# Patient Record
Sex: Male | Born: 1985 | Race: White | Hispanic: No | Smoking: Current every day smoker
Health system: Southern US, Community
[De-identification: ages and names within clinical notes are randomized; demographics above are authoritative.]

## PROBLEM LIST (undated history)

## (undated) DIAGNOSIS — I499 Cardiac arrhythmia, unspecified: Secondary | ICD-10-CM

---

## 2012-01-08 ENCOUNTER — Emergency Department: Payer: Self-pay | Admitting: Unknown Physician Specialty

## 2012-01-08 LAB — BASIC METABOLIC PANEL
Anion Gap: 9 (ref 7–16)
Co2: 26 mmol/L (ref 21–32)
Creatinine: 0.93 mg/dL (ref 0.60–1.30)
EGFR (Non-African Amer.): >60
Glucose: 85 mg/dL (ref 65–99)

## 2012-01-08 LAB — CBC
HCT: 44.1 % (ref 40.0–52.0)
HGB: 14.1 g/dL (ref 13.0–18.0)
MCHC: 32 g/dL (ref 32.0–36.0)
MCV: 79 fL — ABNORMAL LOW (ref 80–100)
RDW: 15.2 % — ABNORMAL HIGH (ref 11.5–14.5)

## 2012-03-14 ENCOUNTER — Emergency Department: Payer: Self-pay | Admitting: Unknown Physician Specialty

## 2012-03-14 LAB — BASIC METABOLIC PANEL
BUN: 5 mg/dL — ABNORMAL LOW (ref 7–18)
Calcium, Total: 9.1 mg/dL (ref 8.5–10.1)
Glucose: 116 mg/dL — ABNORMAL HIGH (ref 65–99)
Osmolality: 278 (ref 275–301)
Potassium: 3.8 mmol/L (ref 3.5–5.1)
Sodium: 140 mmol/L (ref 136–145)

## 2012-03-14 LAB — CBC
MCH: 25.4 pg — ABNORMAL LOW (ref 26.0–34.0)
MCHC: 32 g/dL (ref 32.0–36.0)
MCV: 79 fL — ABNORMAL LOW (ref 80–100)
RBC: 5.16 10*6/uL (ref 4.40–5.90)
RDW: 14.8 % — ABNORMAL HIGH (ref 11.5–14.5)
WBC: 7.1 10*3/uL (ref 3.8–10.6)

## 2012-03-14 LAB — URINALYSIS, COMPLETE
Bilirubin,UR: NEGATIVE
Glucose,UR: NEGATIVE mg/dL (ref 0–75)
Leukocyte Esterase: NEGATIVE
Nitrite: NEGATIVE
Ph: 7 (ref 4.5–8.0)
Protein: NEGATIVE
RBC,UR: <1 /HPF (ref 0–5)
WBC UR: NONE SEEN /HPF (ref 0–5)

## 2012-07-06 ENCOUNTER — Emergency Department: Payer: Self-pay | Admitting: Emergency Medicine

## 2012-07-08 ENCOUNTER — Emergency Department: Payer: Self-pay | Admitting: Emergency Medicine

## 2013-01-23 ENCOUNTER — Emergency Department (HOSPITAL_COMMUNITY): Payer: Self-pay

## 2013-01-23 ENCOUNTER — Encounter (HOSPITAL_COMMUNITY): Payer: Self-pay | Admitting: *Deleted

## 2013-01-23 ENCOUNTER — Emergency Department (HOSPITAL_COMMUNITY)
Admission: EM | Admit: 2013-01-23 | Discharge: 2013-01-23 | Disposition: A | Discharge status: Home / Self Care | Payer: Self-pay | Attending: Emergency Medicine | Admitting: Emergency Medicine

## 2013-01-23 DIAGNOSIS — Z8679 Personal history of other diseases of the circulatory system: Secondary | ICD-10-CM | POA: Insufficient documentation

## 2013-01-23 DIAGNOSIS — F17200 Nicotine dependence, unspecified, uncomplicated: Secondary | ICD-10-CM | POA: Insufficient documentation

## 2013-01-23 DIAGNOSIS — R079 Chest pain, unspecified: Secondary | ICD-10-CM | POA: Insufficient documentation

## 2013-01-23 HISTORY — DX: Cardiac arrhythmia, unspecified: I49.9

## 2013-01-23 LAB — CBC WITH DIFFERENTIAL/PLATELET
Basophils Absolute: 0 10*3/uL (ref 0.0–0.1)
Eosinophils Absolute: 0.3 10*3/uL (ref 0.0–0.7)
Eosinophils Relative: 2 % (ref 0–5)
MCH: 25.9 pg — ABNORMAL LOW (ref 26.0–34.0)
MCHC: 34.1 g/dL (ref 30.0–36.0)
MCV: 75.9 fL — ABNORMAL LOW (ref 78.0–100.0)
Platelets: 219 10*3/uL (ref 150–400)
RDW: 14.8 % (ref 11.5–15.5)

## 2013-01-23 LAB — BASIC METABOLIC PANEL
BUN: 8 mg/dL (ref 6–23)
CO2: 27 mEq/L (ref 19–32)
Chloride: 99 mEq/L (ref 96–112)
GFR calc Af Amer: >90 mL/min (ref >90)
Glucose, Bld: 105 mg/dL — ABNORMAL HIGH (ref 70–99)
Sodium: 134 mEq/L — ABNORMAL LOW (ref 135–145)

## 2013-01-23 MED ORDER — KETOROLAC TROMETHAMINE 60 MG/2ML IM SOLN
60.0000 mg | Freq: Once | INTRAMUSCULAR | Status: AC
  Administered 2013-01-23: 60 mg via INTRAMUSCULAR
  Filled 2013-01-23: qty 2

## 2013-01-23 MED ORDER — OXYCODONE-ACETAMINOPHEN 5-325 MG PO TABS
2.0000 | ORAL_TABLET | Freq: Once | ORAL | Status: AC
  Administered 2013-01-23: 2 via ORAL
  Filled 2013-01-23 (×2): qty 1

## 2013-01-23 MED ORDER — IBUPROFEN 800 MG PO TABS: 800.0000 mg | ORAL_TABLET | Freq: Three times a day (TID) | ORAL | Status: DC

## 2013-01-23 NOTE — ED Notes (Signed)
Patient transported to X-ray 

## 2013-01-23 NOTE — ED Notes (Signed)
Pt reports left side chest pain x 30 mins pta- c/o pain with inspiration- speaking full sentences
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
## 2013-03-28 ENCOUNTER — Emergency Department: Payer: Self-pay | Admitting: Internal Medicine

## 2013-05-15 ENCOUNTER — Emergency Department: Payer: Self-pay | Admitting: Emergency Medicine

## 2013-07-01 ENCOUNTER — Emergency Department: Payer: Self-pay | Admitting: Emergency Medicine

## 2013-08-27 ENCOUNTER — Emergency Department: Payer: Self-pay | Admitting: Emergency Medicine

## 2013-10-12 ENCOUNTER — Emergency Department: Payer: Self-pay | Admitting: Emergency Medicine

## 2014-02-09 ENCOUNTER — Emergency Department: Payer: Self-pay | Admitting: Emergency Medicine

## 2014-07-03 ENCOUNTER — Emergency Department: Payer: Self-pay | Admitting: Emergency Medicine

## 2014-10-03 ENCOUNTER — Emergency Department: Payer: Self-pay | Admitting: Emergency Medicine

## 2014-10-03 LAB — ED INFLUENZA
H1N1FLUPCR: NOT DETECTED
Influenza A By PCR: NEGATIVE
Influenza B By PCR: NEGATIVE

## 2014-10-05 LAB — BETA STREP CULTURE(ARMC)

## 2014-11-14 ENCOUNTER — Emergency Department: Payer: Self-pay | Admitting: Emergency Medicine

## 2015-02-16 IMAGING — CR RIGHT HAND - COMPLETE 3+ VIEW
1 series · 3 of 3 positions shown · non-contrast
Comparison: None.

CLINICAL DATA: Recent fall with soft tissue injury

EXAM:
RIGHT HAND - COMPLETE 3+ VIEW

[Series 1: pa · 0.17mm/px · 3 of 3 slices shown]
[im 1/3]
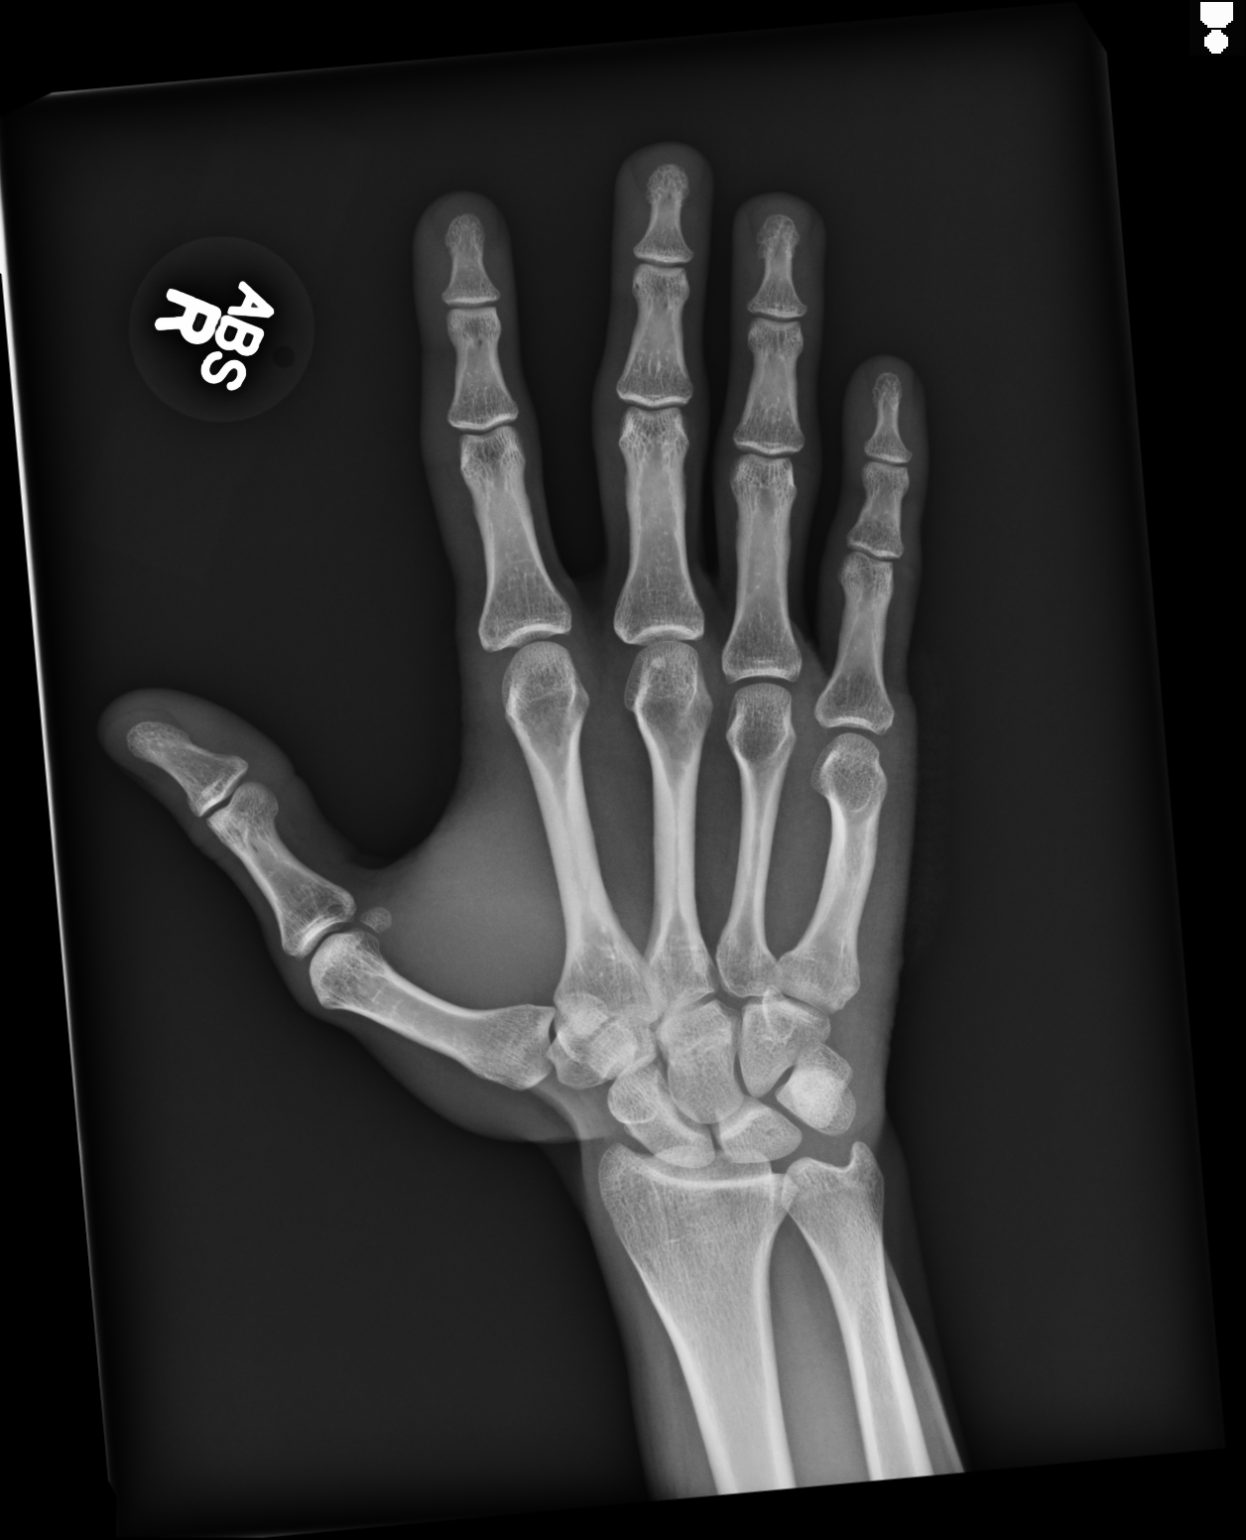
[im 2/3]
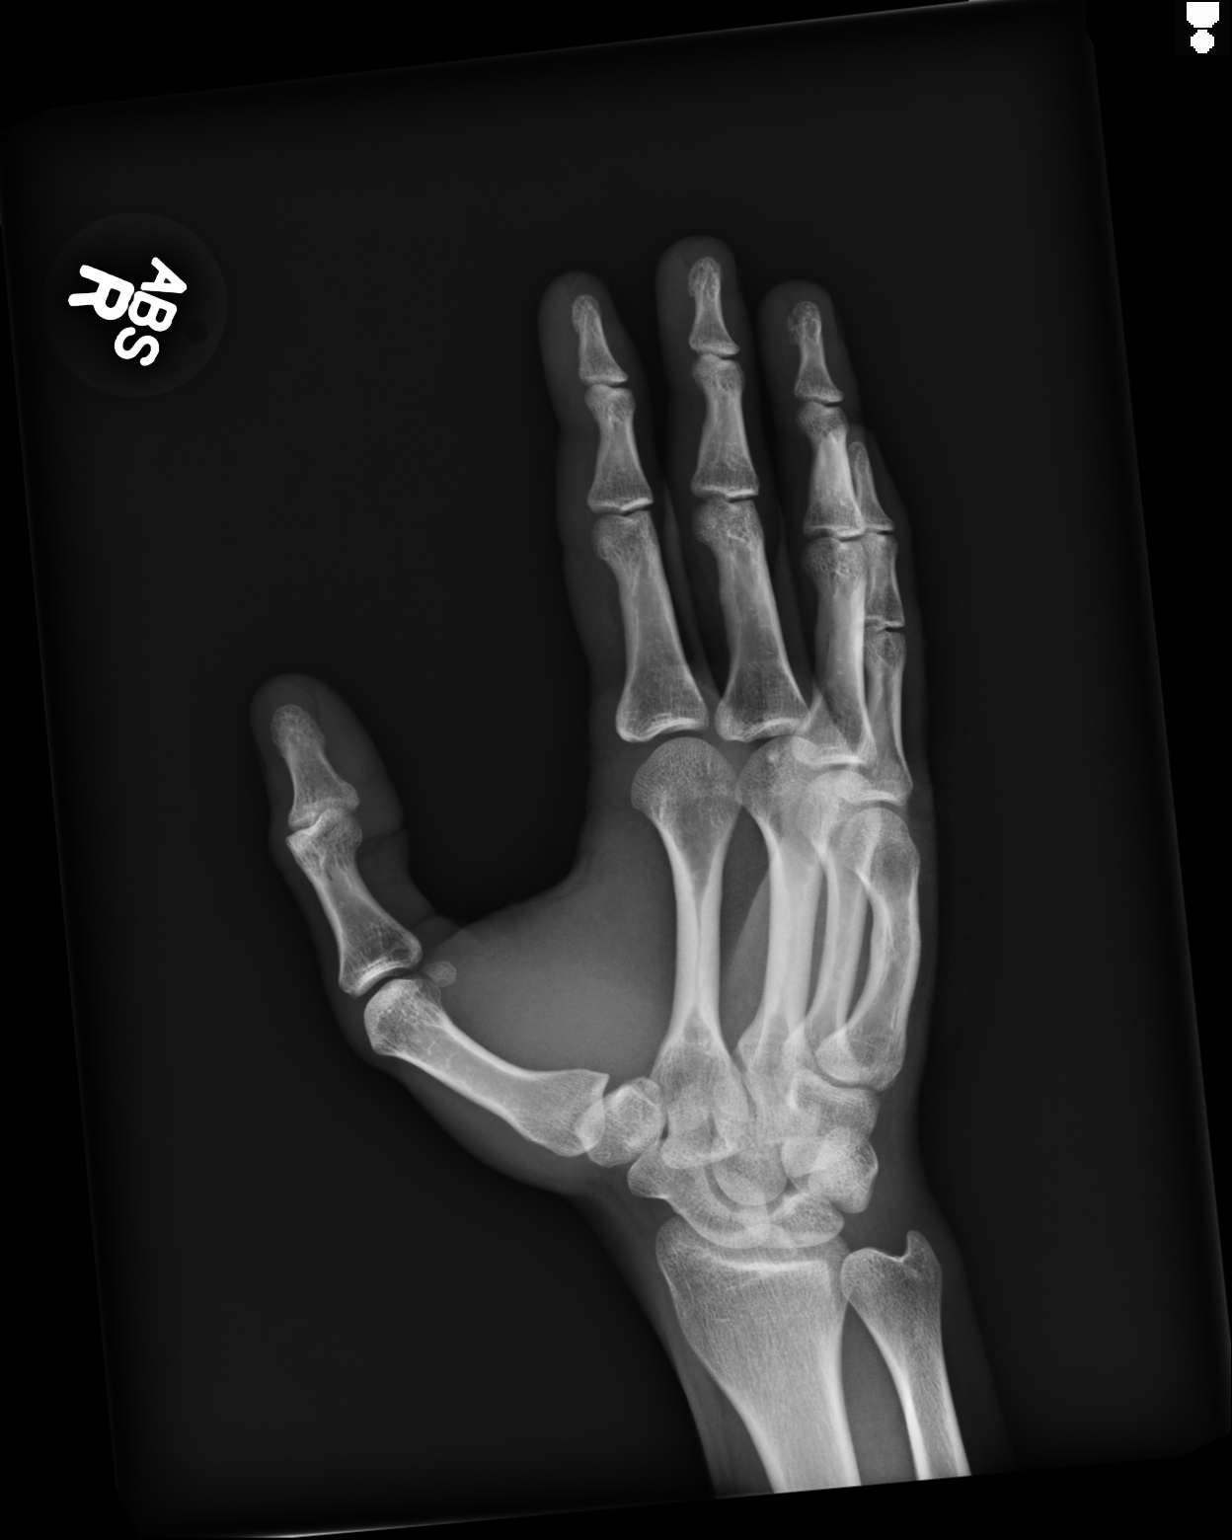
[im 3/3]
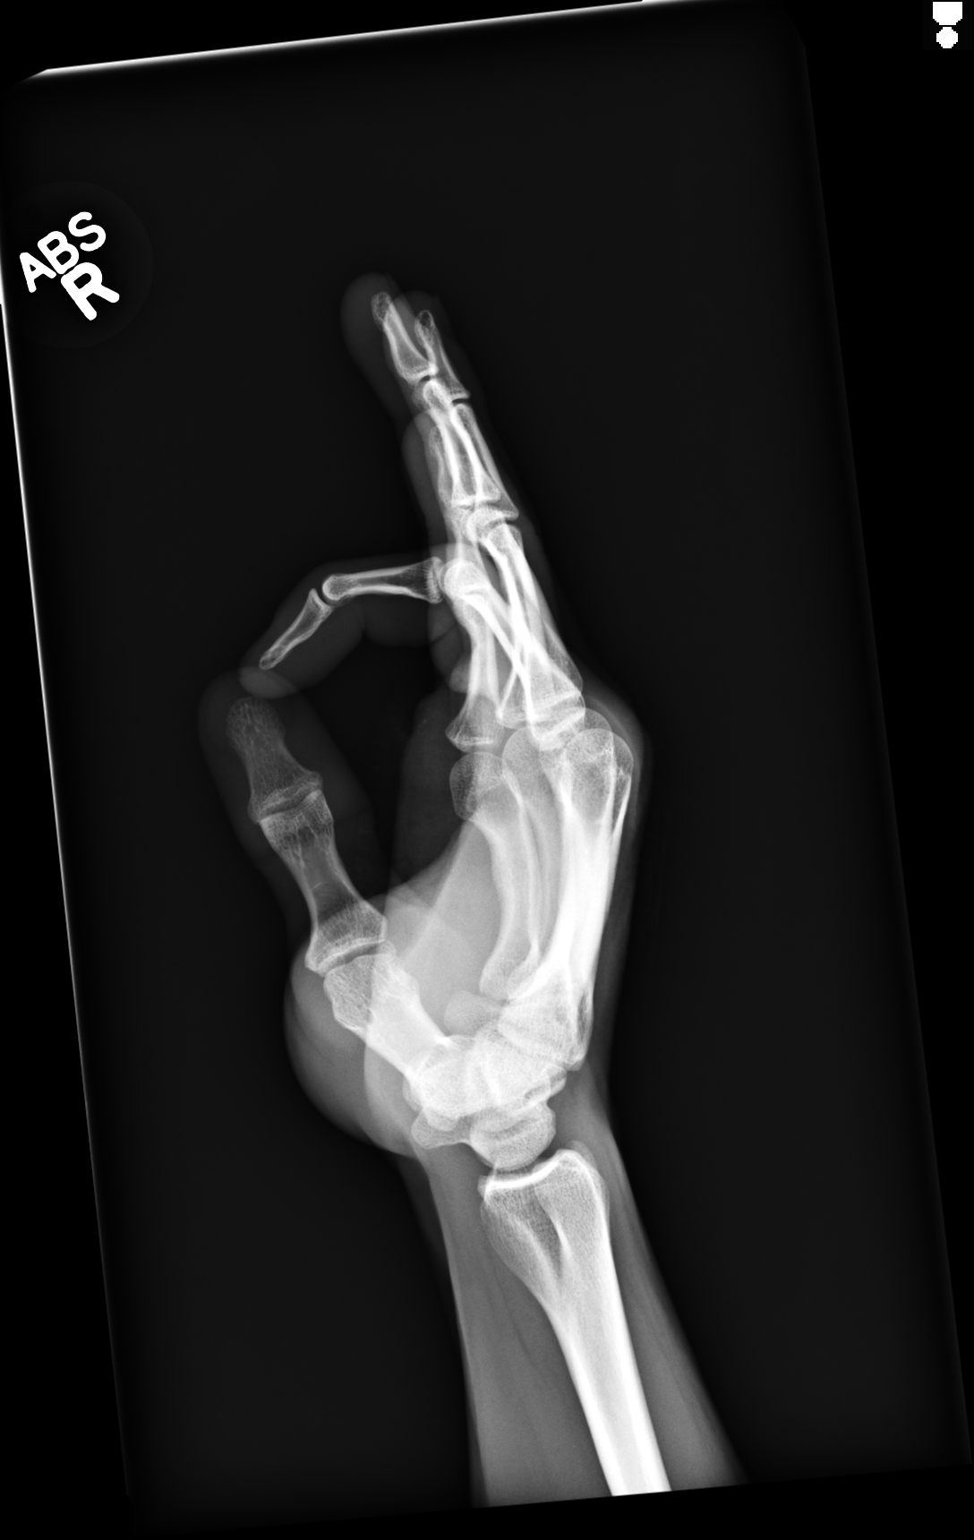

[3 of 3 positions shown; findings below may reference images not displayed]

FINDINGS: No acute fracture or dislocation is noted. No radiopaque foreign
body is seen. Soft tissue injury is noted medially.
IMPRESSION: Soft tissue injury without acute bony abnormality.

## 2015-04-01 ENCOUNTER — Encounter: Payer: Self-pay | Admitting: Emergency Medicine

## 2015-04-01 ENCOUNTER — Emergency Department
Admission: EM | Admit: 2015-04-01 | Discharge: 2015-04-01 | Disposition: A | Discharge status: Home / Self Care | Payer: Self-pay | Attending: Emergency Medicine | Admitting: Emergency Medicine

## 2015-04-01 DIAGNOSIS — Z720 Tobacco use: Secondary | ICD-10-CM | POA: Insufficient documentation

## 2015-04-01 DIAGNOSIS — K0381 Cracked tooth: Secondary | ICD-10-CM | POA: Insufficient documentation

## 2015-04-01 DIAGNOSIS — K0889 Other specified disorders of teeth and supporting structures: Secondary | ICD-10-CM

## 2015-04-01 DIAGNOSIS — K088 Other specified disorders of teeth and supporting structures: Secondary | ICD-10-CM | POA: Insufficient documentation

## 2015-04-01 MED ORDER — TRAMADOL HCL 50 MG PO TABS
50.0000 mg | ORAL_TABLET | Freq: Once | ORAL | Status: AC
  Administered 2015-04-01: 50 mg via ORAL

## 2015-04-01 MED ORDER — TRAMADOL HCL 50 MG PO TABS
ORAL_TABLET | ORAL | Status: AC
  Filled 2015-04-01: qty 1

## 2015-04-01 MED ORDER — IBUPROFEN 800 MG PO TABS: 800.0000 mg | ORAL_TABLET | Freq: Three times a day (TID) | ORAL | Status: DC | PRN

## 2015-04-01 MED ORDER — AMOXICILLIN 500 MG PO CAPS: 500.0000 mg | ORAL_CAPSULE | Freq: Three times a day (TID) | ORAL | Status: DC

## 2015-04-01 MED ORDER — LIDOCAINE VISCOUS 2 % MT SOLN
15.0000 mL | Freq: Once | OROMUCOSAL | Status: AC
  Administered 2015-04-01: 15 mL via OROMUCOSAL

## 2015-04-01 MED ORDER — IBUPROFEN 800 MG PO TABS
ORAL_TABLET | ORAL | Status: AC
  Filled 2015-04-01: qty 1

## 2015-04-01 MED ORDER — TRAMADOL HCL 50 MG PO TABS: 50.0000 mg | ORAL_TABLET | Freq: Four times a day (QID) | ORAL | Status: DC | PRN

## 2015-04-01 MED ORDER — IBUPROFEN 800 MG PO TABS
800.0000 mg | ORAL_TABLET | Freq: Once | ORAL | Status: AC
  Administered 2015-04-01: 800 mg via ORAL

## 2015-04-01 MED ORDER — LIDOCAINE VISCOUS 2 % MT SOLN
OROMUCOSAL | Status: AC
  Filled 2015-04-01: qty 15
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
## 2015-04-01 NOTE — ED Notes (Signed)
See providers note for full assessment

## 2015-04-01 NOTE — Discharge Instructions (Signed)
Follow-up from list of dental clinics. °OPTIONS FOR DENTAL FOLLOW UP CARE ° °[place] Department of Health and Human Services - Local Safety Net Dental Clinics °http://www.ncdhhs.gov/dph/oralhealth/services/safetynetclinics.htm °  °[facility] ([phone]) ° °[facility] ([phone]) ° °[facility] ([phone]) ° °[facility] ([phone]) ° °[facility] ([phone]) °This clinic caters to the indigent population and is on a lottery system. °Location: °[facility] [place], [address], [place] °Clinic Hours: °Wednesdays from 6pm - 9pm, patients seen by a lottery system. °For dates, call or go to [email or web address] °Services: °Cleanings, fillings and simple extractions. °Payment Options: °DENTAL WORK IS FREE OF CHARGE. Bring proof of income or support. °Best way to get seen: °Arrive at 5:15 pm - this is a lottery, NOT first come/first serve, so arriving earlier will not increase your chances of being seen. °  °  °[facility] °[phone] °Select option 1 for emergencies °  °Location: °[facility], [address], [place] °Clinic Hours: °No walk-ins accepted - call the day before to schedule an appointment. °Check in times are 9:30 am and 1:30 pm. °Services: °Simple extractions, temporary fillings, pulpectomy/pulp debridement, uncomplicated abscess drainage. °Payment Options: °PAYMENT IS DUE AT THE TIME OF SERVICE.  Fee is usually $100-200, additional surgical procedures (e.g. abscess drainage) may be extra. °Cash, checks, Visa/MasterCard accepted.  Can file Medicaid if patient is covered for dental - patient should call case worker to check. °No discount for [facility] patients. °Best way to get seen: °MUST call the day before and get onto the schedule. Can usually be seen the next 1-2 days. No walk-ins accepted. °  °  °[facility] °[phone] °  °Location: °[facility], [address], [place] °Clinic Hours: °M, W, Th, F 8am or 1:30pm, Tues 9a or 1:30 - first come/first served. °Services: °Simple extractions, temporary fillings, uncomplicated abscess drainage.  You do not need to be an [place] resident. °Payment Options: °PAYMENT IS DUE AT THE TIME OF SERVICE. °Dental insurance, otherwise sliding scale - bring proof of income or support. °Depending on income and treatment needed, cost is usually $50-200. °Best way to get seen: °Arrive early as it is first come/first served. °  °  °[facility] °[phone] °  °Location: °[address] °Clinic Hours: °Mon-Thu 8a-5p °Services: °Most basic dental services including extractions and fillings. °Payment Options: °PAYMENT IS DUE AT THE TIME OF SERVICE. °Sliding scale, up to 50% off - bring proof if income or support. °Medicaid with dental option accepted. °Best way to get seen: °Call to schedule an appointment, can usually be seen within 2 weeks OR they will try to see walk-ins - show up at 8a or 2p (you may have to wait). °  °  °[facility] °[phone] °[place] RESIDENTS ONLY °  °Location: °[facility], [address], [place], [place] [address] °Clinic Hours: By appointment only. °Monday - Thursday 8am-5pm, Friday 8am-12pm °Services: Cleanings, fillings, extractions. °Payment Options: °PAYMENT IS DUE AT THE TIME OF SERVICE. °Cash, Visa or MasterCard. Sliding scale - $30 minimum per service. °Best way to get seen: °Come in to office, complete packet and make an appointment - need proof of income °or support monies for each household member and proof of [place] residence. °Usually takes about a month to get in. °  °  °[facility] °[phone] °  °  Location: °[address] [place] °Clinic Hours: Walk-in Urgent Care Dental Services  are offered Monday-Friday mornings only. °The numbers of emergencies accepted daily is limited to the number of °providers available. °Maximum 15 - Mondays, Wednesdays & Thursdays °Maximum 10 - Tuesdays & Fridays °Services: °You do not need to be a [place] resident to be seen for a dental emergency. °Emergencies are defined as pain, swelling, abnormal bleeding, or dental trauma. Walkins will receive x-rays if needed. °NOTE: Dental cleaning is not an emergency. °Payment Options: °PAYMENT IS DUE AT THE TIME OF SERVICE. °Minimum co-pay is $40.00 for uninsured patients. °Minimum co-pay is $3.00 for Medicaid with dental coverage. °Dental Insurance is accepted and must be presented at time of visit. °Medicare does not cover dental. °Forms of payment: Cash, credit card, checks. °Best way to get seen: °If not previously registered with the clinic, walk-in dental registration begins at 7:15 am and is on a first come/first serve basis. °If previously registered with the clinic, call to make an appointment. °  °  °[facility] °[phone] °[place] RESIDENTS ONLY °  °Location: °[address], [place], [place] °Clinic Hours: °Mon-Thu 10a-2p °Services: Extractions only! °Payment Options: °FREE (donations accepted) - bring proof of income or support °Best way to get seen: °Call and schedule an appointment OR come at 8am on the 1st Monday of every month (except for holidays) when it is first come/first served. °  °  °[facility] °[phone] °  °Location: °[address], [place] °Clinic Hours: °Friday mornings °Services, Payment Options, Best way to get seen: °Call for info ° °

## 2015-04-01 NOTE — ED Notes (Signed)
Pt to ed with c/o dental pain on left side. Pt states he broke a tooth off 2 days ago.  Pt reports increased pain since, unable to eat.

## 2015-06-24 ENCOUNTER — Emergency Department
Admission: EM | Admit: 2015-06-24 | Discharge: 2015-06-24 | Disposition: A | Discharge status: Home / Self Care | Payer: Self-pay | Attending: Student | Admitting: Student

## 2015-06-24 ENCOUNTER — Encounter: Payer: Self-pay | Admitting: Emergency Medicine

## 2015-06-24 DIAGNOSIS — Z720 Tobacco use: Secondary | ICD-10-CM | POA: Insufficient documentation

## 2015-06-24 DIAGNOSIS — F1910 Other psychoactive substance abuse, uncomplicated: Secondary | ICD-10-CM

## 2015-06-24 DIAGNOSIS — F1110 Opioid abuse, uncomplicated: Secondary | ICD-10-CM | POA: Insufficient documentation

## 2015-06-24 DIAGNOSIS — R197 Diarrhea, unspecified: Secondary | ICD-10-CM | POA: Insufficient documentation

## 2015-06-24 DIAGNOSIS — F1410 Cocaine abuse, uncomplicated: Secondary | ICD-10-CM | POA: Insufficient documentation

## 2015-06-24 MED ORDER — NICOTINE 10 MG IN INHA
RESPIRATORY_TRACT | Status: AC
  Filled 2015-06-24: qty 36

## 2015-06-24 MED ORDER — NICOTINE 10 MG IN INHA
1.0000 | RESPIRATORY_TRACT | Status: DC | PRN
Start: 2015-06-24 — End: 2015-06-24

## 2015-06-24 MED ORDER — CYCLOBENZAPRINE HCL 10 MG PO TABS
5.0000 mg | ORAL_TABLET | Freq: Once | ORAL | Status: AC
  Administered 2015-06-24: 5 mg via ORAL
  Filled 2015-06-24: qty 1

## 2015-06-24 NOTE — ED Notes (Signed)
Pt says he is going to [facility] and called them.  They will pick him up.  He went to bathroom to change.

## 2015-06-24 NOTE — BH Assessment (Signed)
Assessment Note  Richard Tanner is an 29 y.o. male presenting to ED for detox from heroin (.5-1gram daily, last use "Saturday" 06/21/15)  and cocaine (.1 gram daily, last use "Saturday" 06/21/15).   Pt reports he began using both substances four months ago. Pt endorses the following sxs of withdrawal: diarrhea, nausea and cramps. Pt states that his girlfriend is a longtime user (currently in rehab) and that he began using in hopes that it would keep them together. Pt states that he currently has a [facility] but came to the ED for detox "just in case". Pt reports a hx of depression however, endorses no current sxs. Pt reports no SI, HI, self-injurious behaviors or hallucinations.   Writer consulted with EDP Dr.Gayle and Pt is not in need of "medical" detox services. Pt does not meet inpatient criteria. Pt informed Clinical research associate that he is able to return to tx facility and was told that facility with transport him back. Pt to be discharged and provided with additional outpatient community resources per [facility].  Axis I: Substance Abuse  Past Medical History:  Past Medical History  Diagnosis Date  . Cardiac arrhythmia     History reviewed. No pertinent past surgical history.  Family History: History reviewed. No pertinent family history.  Social History:  reports that he has been smoking Cigarettes.  He has been smoking about 0.50 packs per day. He has never used smokeless tobacco. He reports that he drinks about 0.6 oz of alcohol per week. He reports that he uses illicit drugs (Marijuana and Cocaine).  Additional Social History:  Alcohol / Drug Use Pain Medications: None Reported Prescriptions: None reported Over the Counter: None Reported History of alcohol / drug use?: Yes Longest period of sobriety (when/how long): None Reported Negative Consequences of Use:  (None Reported) Withdrawal Symptoms: Nausea / Vomiting, Diarrhea, Cramps Substance #1 Name of Substance 1:  Heroin 1 - Age of First Use: 29 1 - Amount (size/oz): 5-1 gram 1 - Frequency: daily 1 - Duration: 4 months 1 - Last Use / Amount: "Saturday" 06/21/15 Substance #2 Name of Substance 2: Cocaine 2 - Age of First Use: 29 2 - Amount (size/oz): .1gram 2 - Frequency: daily 2 - Duration: 4 months 2 - Last Use / Amount: "saturday" 06/21/15  CIWA: CIWA-Ar BP: 128/74 mmHg Pulse Rate: 82 COWS:    Allergies: No Known Allergies  Home Medications:  (Not in a hospital admission)  OB/GYN Status:  No LMP for male patient.  General Assessment Data Location of Assessment: [facility] ED TTS Assessment: In system Is this a Tele or Face-to-Face Assessment?: Face-to-Face Is this an Initial Assessment or a Re-assessment for this encounter?: Initial Assessment Marital status: Separated Maiden name: N/A Is patient pregnant?: No Pregnancy Status: No Living Arrangements: Non-relatives/Friends Can pt return to current living arrangement?: Yes Admission Status: Voluntary Is patient capable of signing voluntary admission?: Yes Referral Source: Self/Family/Friend Insurance type: None  Medical Screening Exam [facility] Walk-in ONLY) Medical Exam completed: Yes  Crisis Care Plan Living Arrangements: Non-relatives/Friends Name of Psychiatrist: None  Name of Therapist: None  Education Status Is patient currently in school?: No Current Grade: N/A Highest grade of school patient has completed: [employer] Name of school: N/A Contact person: Not Provided  Risk to self with the past 6 months Suicidal Ideation: No Has patient been a risk to self within the past 6 months prior to admission? : No Suicidal Intent: No Has patient had any suicidal intent within  the past 6 months prior to admission? : No Is patient at risk for suicide?: No Suicidal Plan?: No Has patient had any suicidal plan within the past 6 months prior to admission? : No Access to Means: No What has been your use of drugs/alcohol within the  last 12 months?: Daily cocaine and heroin use for past 4 months Previous Attempts/Gestures: No How many times?: 0 Other Self Harm Risks: None Reported Intentional Self Injurious Behavior: None Family Suicide History: No Recent stressful life event(s):  (None Reported) Persecutory voices/beliefs?: No Depression: Yes Depression Symptoms:  (None Endorsed) Substance abuse history and/or treatment for substance abuse?: Yes Suicide prevention information given to non-admitted patients: Not applicable  Risk to Others within the past 6 months Homicidal Ideation: No Does patient have any lifetime risk of violence toward others beyond the six months prior to admission? 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, early years/pre) Is patient on probation?: No  Psychosis Hallucinations: None noted Delusions: None noted  Mental Status Report Appearance/Hygiene: In scrubs Eye Contact: Good Motor Activity: Unremarkable Speech: Soft Level of Consciousness: Alert Mood: Pleasant Affect: Appropriate to circumstance Anxiety Level: Minimal Thought Processes: Coherent, Relevant Judgement: Unimpaired Orientation: Person, Place, Time, Situation Obsessive Compulsive Thoughts/Behaviors: None  Cognitive Functioning Concentration: Good Memory: Recent Intact, Remote Intact IQ: Average Insight: Good Impulse Control: Poor Appetite: Good Weight Loss: 10 Weight Gain: 0 Sleep: No Change Total Hours of Sleep: 3 Vegetative Symptoms: None  ADLScreening [facility] Assessment Services) Patient's cognitive ability adequate to safely complete daily activities?: Yes Patient able  to express need for assistance with ADLs?: Yes Independently performs ADLs?: Yes (appropriate for developmental age)  Prior Inpatient Therapy Prior Inpatient Therapy: No Prior Therapy Dates: N/A Prior Therapy Facilty/Provider(s): N/A Reason for Treatment: N/A  Prior Outpatient Therapy Prior Outpatient Therapy: Yes Prior Therapy Dates: "a few years ago" Prior Therapy Facilty/Provider(s): [facility] Reason for Treatment: depression Does patient have an ACCT team?: No Does patient have Intensive In-House Services?  : No Does patient have [facility] services? : No Does patient have P4CC services?: No  ADL Screening (condition at time of admission) Patient's cognitive ability adequate to safely complete daily activities?: Yes Is the patient deaf or have difficulty hearing?: No Does the patient have difficulty seeing, even when wearing glasses/contacts?: No Does the patient have difficulty concentrating, remembering, or making decisions?: No Patient able to express need for assistance with ADLs?: Yes Does the patient have difficulty dressing or bathing?: No Independently performs ADLs?: Yes (appropriate for developmental age) Does the patient have difficulty walking or climbing stairs?: No Weakness of Legs: None Weakness of Arms/Hands: None  Home Assistive Devices/Equipment Home Assistive Devices/Equipment: None  Therapy Consults (therapy consults require a physician order) PT Evaluation Needed: No OT Evalulation Needed: No SLP Evaluation Needed: No Abuse/Neglect Assessment (Assessment to be complete while patient is alone) Physical Abuse: Denies Verbal Abuse: Denies Sexual Abuse: Denies Exploitation of patient/patient's resources: Denies Self-Neglect: Denies Values / Beliefs Cultural Requests During Hospitalization: None Spiritual Requests During Hospitalization: None Consults Spiritual Care Consult Needed: No Social Work Consult Needed: No Merchant navy officer (For Healthcare) Does  patient have an advance directive?: No Would patient like information on creating an advanced  directive?: No - patient declined information    Additional Information 1:1 In Past 12 Months?: No CIRT Risk: No Elopement Risk: No Does patient have medical clearance?: Yes     Disposition:  Disposition Initial Assessment Completed for this Encounter: Yes Disposition of Patient: Outpatient treatment  On Site Evaluation by:   Reviewed with Physician:    Fatima J [place] 06/24/2015 3:30 PM

## 2015-06-24 NOTE — ED Notes (Signed)
Pt to ed with c/o wanting detox from heroin and cocaine.  Pt denies si. Denies hi.  Denies hallucinations.

## 2015-06-24 NOTE — Discharge Instructions (Signed)
You have been seen in the Emergency Department (ED) today for polysubstance abuse.  Unfortunately there is no room at the [facility] at this time.  He is referred to the list of outpatient resources were provided.  You can also consider such resources as [facility] and [facility].  Please return to the ED immediately if you have ANY thoughts of hurting yourself or anyone else, so that we may help you.  Please avoid alcohol and drug use.  Follow up with your doctor and/or therapist as soon as possible regarding today's ED visit.   Please follow up any other recommendations and clinic appointments provided by the psychiatry team that saw you in the Emergency Department.   Chemical Dependency Chemical dependency is an addiction to drugs or alcohol. It is characterized by the repeated behavior of seeking out and using drugs and alcohol despite harmful consequences to the health and safety of ones self and others.  RISK FACTORS There are certain situations or behaviors that increase a person's risk for chemical dependency. These include:  A family history of chemical dependency.  A history of mental health issues, including depression and anxiety.  A home environment where drugs and alcohol are easily available to you.  Drug or alcohol use at a young age. SYMPTOMS  The following symptoms can indicate chemical dependency:  Inability to limit the use of drugs or alcohol.  Nausea, sweating, shakiness, and anxiety that occurs when alcohol or drugs are not being used.  An increase in amount of drugs or alcohol that is necessary to get drunk or high. People who experience these symptoms can assess their use of drugs and alcohol by asking themselves the following questions:  Have you been told by friends or family that they are worried about your use of alcohol or drugs?  Do friends and family ever tell you about things you did while drinking alcohol or  using drugs that you do not remember?  Do you lie about using alcohol or drugs or about the amounts you use?  Do you have difficulty completing daily tasks unless you use alcohol or drugs?  Is the level of your work or school performance lower because of your drug or alcohol use?  Do you get sick from using drugs or alcohol but keep using anyway?  Do you feel uncomfortable in social situations unless you use alcohol or drugs?  Do you use drugs or alcohol to help forget problems? An answer of yes to any of these questions may indicate chemical dependency. Professional evaluation is suggested. Document Released: 09/14/2001 Document Revised: 12/13/2011 Document Reviewed: 11/26/2010 [facility] Patient Information 2015 [place], [place]. This information is not intended to replace advice given to you by your health care provider. Make sure you discuss any questions you have with your health care provider.

## 2015-06-24 NOTE — ED Provider Notes (Signed)
[facility] Emergency Department Provider Note  ____________________________________________  Time seen: Approximately 9:32 AM  I have reviewed the triage vital signs and the nursing notes.   HISTORY  Chief Complaint wanting detox     HPI Richard Tanner is a 29 y.o. male with history of palpitations but no documented cardiac arrhythmia, no other medical problems except for polysubstance abuse who presents for evaluation for detox off of heroin and cocaine last used approximately 2 days ago. He reports he has been using these substances for 4 months. He denies any suicidal ideation or homicidal ideation, no audiovisual hallucinations. No chest pain or difficulty breathing. He did have some nonbloody nonbilious emesis today as well as diarrhea but has tolerated by mouth intake since. Current severity of symptoms is mild to moderate. No modifying factors.   Past Medical History  Diagnosis Date  . Cardiac arrhythmia     There are no active problems to display for this patient.   History reviewed. No pertinent past surgical history.  Current Outpatient Rx  Name  Route  Sig  Dispense  Refill  . amoxicillin (AMOXIL) 500 MG capsule   Oral   Take 1 capsule (500 mg total) by mouth 3 (three) times daily. Patient not taking: Reported on 06/24/2015   30 capsule   0   . ibuprofen (ADVIL,MOTRIN) 800 MG tablet   Oral   Take 1 tablet (800 mg total) by mouth every 8 (eight) hours as needed for moderate pain. Patient not taking: Reported on 06/24/2015   15 tablet   0   . traMADol (ULTRAM) 50 MG tablet   Oral   Take 1 tablet (50 mg total) by mouth every 6 (six) hours as needed. Patient not taking: Reported on 06/24/2015   20 tablet   0     Allergies Review of patient's allergies indicates no known allergies.  History reviewed. No pertinent family history.  Social History Social History  Substance Use Topics  . Smoking status: Current Every Day  Smoker -- 0.50 packs/day    Types: Cigarettes  . Smokeless tobacco: Never Used  . Alcohol Use: 0.6 oz/week    1 Cans of beer per week    Review of Systems Constitutional: No fever/chills Eyes: No visual changes. ENT: No sore throat. Cardiovascular: Denies chest pain. Respiratory: Denies shortness of breath. Gastrointestinal: No abdominal pain.  No nausea, + vomiting.  + diarrhea.  No constipation. Genitourinary: Negative for dysuria. Musculoskeletal: Negative for back pain. Skin: Negative for rash. Neurological: Negative for headaches, focal weakness or numbness.  10-point ROS otherwise negative.  ____________________________________________   PHYSICAL EXAM:  VITAL SIGNS: ED Triage Vitals  Enc Vitals Group     BP 06/24/15 0905 128/74 mmHg     Pulse Rate 06/24/15 0905 82     Resp 06/24/15 0905 20     Temp 06/24/15 0905 98.2 F (36.8 C)     Temp Source 06/24/15 0905 Oral     SpO2 06/24/15 0905 99 %     Weight 06/24/15 0905 125 lb (56.7 kg)     Height 06/24/15 0905  (1.6 m)     Head Cir --      Peak Flow --      Pain Score 06/24/15 0906 0     Pain Loc --      Pain Edu? --      Excl. in GC? --     Constitutional: Alert and oriented. Well appearing and in no acute  distress. Eyes: Conjunctivae are normal. PERRL. EOMI. Head: Atraumatic. Nose: No congestion/rhinnorhea. Mouth/Throat: Mucous membranes are moist.  Oropharynx non-erythematous. Neck: No stridor.   Cardiovascular: Normal rate, regular rhythm. Grossly normal heart sounds.  Good peripheral circulation. Respiratory: Normal respiratory effort.  No retractions. Lungs CTAB. Gastrointestinal: Soft and nontender. No distention. No abdominal bruits. No CVA tenderness. Genitourinary: deferred Musculoskeletal: No lower extremity tenderness nor edema.  No joint effusions. Neurologic:  Normal speech and language. No gross focal neurologic deficits are appreciated. No gait instability. Skin:  Skin is warm, dry and  intact. No rash noted. Psychiatric: Mood and affect are normal. Speech and behavior are normal.  ____________________________________________   LABS (all labs ordered are listed, but only abnormal results are displayed)  Labs Reviewed - No data to display ____________________________________________  EKG  none ____________________________________________  RADIOLOGY  none ____________________________________________   PROCEDURES  Procedure(s) performed: None  Critical Care performed: No  ____________________________________________   INITIAL IMPRESSION / ASSESSMENT AND PLAN / ED COURSE  Pertinent labs & imaging results that were available during my care of the patient were reviewed by me and considered in my medical decision making (see chart for details).  Richard Tanner is a 29 y.o. male with history of palpitations but no documented cardiac arrhythmia, no other medical problems except for polysubstance abuse who presents for evaluation for detox off of heroin and cocaine. On exam, he is generally well-appearing and in no acute distress. Vital signs stable, he is afebrile. He has no acute medical complaints and benign examination. No indication for labs at this time. Will consult TTS for assistance with detox placement.  ____________________________________________   FINAL CLINICAL IMPRESSION(S) / ED DIAGNOSES  Final diagnoses:  Polysubstance abuse      Richard Doss, MD 06/24/15 1540

## 2015-07-16 ENCOUNTER — Emergency Department
Admission: EM | Admit: 2015-07-16 | Discharge: 2015-07-16 | Disposition: A | Discharge status: Home / Self Care | Payer: Self-pay | Attending: Emergency Medicine | Admitting: Emergency Medicine

## 2015-07-16 ENCOUNTER — Encounter: Payer: Self-pay | Admitting: Emergency Medicine

## 2015-07-16 DIAGNOSIS — K0381 Cracked tooth: Secondary | ICD-10-CM | POA: Insufficient documentation

## 2015-07-16 DIAGNOSIS — K0889 Other specified disorders of teeth and supporting structures: Secondary | ICD-10-CM | POA: Insufficient documentation

## 2015-07-16 DIAGNOSIS — K029 Dental caries, unspecified: Secondary | ICD-10-CM | POA: Insufficient documentation

## 2015-07-16 DIAGNOSIS — Z720 Tobacco use: Secondary | ICD-10-CM | POA: Insufficient documentation

## 2015-07-16 MED ORDER — LIDOCAINE VISCOUS 2 % MT SOLN: 20.0000 mL | OROMUCOSAL | Status: DC | PRN

## 2015-07-16 MED ORDER — AMOXICILLIN 500 MG PO TABS: 500.0000 mg | ORAL_TABLET | Freq: Two times a day (BID) | ORAL | Status: DC

## 2015-07-16 MED ORDER — IBUPROFEN 800 MG PO TABS: 800.0000 mg | ORAL_TABLET | Freq: Three times a day (TID) | ORAL | Status: DC | PRN

## 2015-07-16 MED ORDER — TRAMADOL HCL 50 MG PO TABS
50.0000 mg | ORAL_TABLET | Freq: Four times a day (QID) | ORAL | Status: DC | PRN
Start: 2015-07-16 — End: 2015-10-30

## 2015-07-16 NOTE — ED Notes (Signed)
PT here for left sided dental pain. Reports multiple broken teeth.

## 2015-07-16 NOTE — Discharge Instructions (Signed)
OPTIONS FOR DENTAL FOLLOW UP CARE ° °[place] Department of Health and Human Services - Local Safety Net Dental Clinics °http://www.ncdhhs.gov/dph/oralhealth/services/safetynetclinics.htm °  °[facility] (336-562-3123) ° °[facility] [place] ([phone]) ° °[facility] ([phone] ext [phone]) ° °[facility] ([phone]) ° °[facility] ([phone]) °This clinic caters to the indigent population and is on a lottery system. °Location: °[facility], [address], [place] °Clinic Hours: °Wednesdays from 6pm - 9pm, patients seen by a lottery system. °For dates, call or go to [email or web address].[facility].edu/[facility]/patients/Dental-[facility] °Services: °Cleanings, fillings and simple extractions. °Payment Options: °DENTAL WORK IS FREE OF CHARGE. Bring proof of income or support. °Best way to get seen: °Arrive at 5:15 pm - this is a lottery, NOT first come/first serve, so arriving earlier will not increase your chances of being seen. °  °  °[facility] °[phone] °Select option 1 for emergencies °  °Location: °[facility], [address], [place] °Clinic Hours: °No walk-ins accepted - call the day before to schedule an appointment. °Check in times are 9:30 am and 1:30 pm. °Services: °Simple extractions, temporary fillings, pulpectomy/pulp debridement, uncomplicated abscess drainage. °Payment Options: °PAYMENT IS DUE AT THE TIME OF SERVICE.  Fee is usually $100-200, additional surgical procedures (e.g. abscess drainage) may be extra. °Cash, checks, Visa/MasterCard accepted.  Can file Medicaid if patient is covered for dental - patient should call case worker to check. °No discount for [facility] patients. °Best way to get seen: °MUST call the day before and get onto the schedule. Can usually be seen the next 1-2 days. No walk-ins accepted. °  °  °[facility] °[phone] °   °Location: °[facility], [address], [place] °Clinic Hours: °M, W, Th, F 8am or 1:30pm, Tues 9a or 1:30 - first come/first served. °Services: °Simple extractions, temporary fillings, uncomplicated abscess drainage.  You do not need to be an [place] resident. °Payment Options: °PAYMENT IS DUE AT THE TIME OF SERVICE. °Dental insurance, otherwise sliding scale - bring proof of income or support. °Depending on income and treatment needed, cost is usually $50-200. °Best way to get seen: °Arrive early as it is first come/first served. °  °  °[facility] °[phone] °  °Location: °[address] °Clinic Hours: °Mon-Thu 8a-5p °Services: °Most basic dental services including extractions and fillings. °Payment Options: °PAYMENT IS DUE AT THE TIME OF SERVICE. °Sliding scale, up to 50% off - bring proof if income or support. °Medicaid with dental option accepted. °Best way to get seen: °Call to schedule an appointment, can usually be seen within 2 weeks OR they will try to see walk-ins - show up at 8a or 2p (you may have to wait). °  °  °[facility] °[phone] °[place] RESIDENTS ONLY °  °Location: °[facility], [address], [place],  [address] °Clinic Hours: By appointment only. °Monday - Thursday 8am-5pm, Friday 8am-12pm °Services: Cleanings, fillings, extractions. °Payment Options: °PAYMENT IS DUE AT THE TIME OF SERVICE. °Cash, Visa or MasterCard. Sliding scale - $30 minimum per service. °Best way to get seen: °Come in to office, complete packet and make an appointment - need proof of income °or support monies for each household member and proof of [place] residence. °Usually takes about a month to get in. °  °  °[facility] °[phone] °  °Location: °[address]   [place] °Clinic Hours: Walk-in Urgent Care Dental Services are offered Monday-Friday  mornings only. °The numbers of emergencies accepted daily is limited to the number of °providers available. °Maximum 15 - Mondays, Wednesdays & Thursdays °Maximum 10 - Tuesdays & Fridays °Services: °You do not need to be a [place] resident to be seen for a dental emergency. °Emergencies are defined as pain, swelling, abnormal bleeding, or dental trauma. Walkins will receive x-rays if needed. °NOTE: Dental cleaning is not an emergency. °Payment Options: °PAYMENT IS DUE AT THE TIME OF SERVICE. °Minimum co-pay is $40.00 for uninsured patients. °Minimum co-pay is $3.00 for Medicaid with dental coverage. °Dental Insurance is accepted and must be presented at time of visit. °Medicare does not cover dental. °Forms of payment: Cash, credit card, checks. °Best way to get seen: °If not previously registered with the clinic, walk-in dental registration begins at 7:15 am and is on a first come/first serve basis. °If previously registered with the clinic, call to make an appointment. °  °  °[facility] °[phone] °[place] RESIDENTS ONLY °  °Location: °[address], [place], [place] °Clinic Hours: °Mon-Thu 10a-2p °Services: Extractions only! °Payment Options: °FREE (donations accepted) - bring proof of income or support °Best way to get seen: °Call and schedule an appointment OR come at 8am on the 1st Monday of every month (except for holidays) when it is first come/first served. °  °  °[facility] °[phone] °  °Location: °[address], [place] °Clinic Hours: °Friday mornings °Services, Payment Options, Best way to get seen: °Call for info ° ° ° °Dental Caries °Dental caries is tooth decay. This decay can cause a hole in teeth (cavity) that can get bigger and deeper over time. °HOME CARE °· Brush and floss your teeth. Do this at least two times a day. °· Use a fluoride toothpaste. °· Use a mouth rinse if told by your dentist or doctor. °· Eat less sugary and starchy foods. Drink less sugary  drinks. °· Avoid snacking often on sugary and starchy foods. Avoid sipping often on sugary drinks. °· Keep regular checkups and cleanings with your dentist. °· Use fluoride supplements if told by your dentist or doctor. °· Allow fluoride to be applied to teeth if told by your dentist or doctor. °  °This information is not intended to replace advice given to you by your health care provider. Make sure you discuss any questions you have with your health care provider. °  °Document Released: 06/29/2008 Document Revised: 10/11/2014 Document Reviewed: 09/22/2012 °[employer] Interactive Patient Education ©2016 [employer] ° °

## 2015-07-16 NOTE — ED Provider Notes (Signed)
[facility] Emergency Department Provider Note  ____________________________________________  Time seen: Approximately 5:01 PM  I have reviewed the triage vital signs and the nursing notes.   HISTORY  Chief Complaint Dental Pain    HPI Corwyn Vora is a 29 y.o. male who presents for evaluation of left lower and upper tooth pain secondary to dental caries. Patient states he is in rehabilitation and cannot take any controlled drugs. Just needs something to help with the pain temporarily.   Past Medical History  Diagnosis Date  . Cardiac arrhythmia     There are no active problems to display for this patient.   No past surgical history on file.  Current Outpatient Rx  Name  Route  Sig  Dispense  Refill  . amoxicillin (AMOXIL) 500 MG tablet   Oral   Take 1 tablet (500 mg total) by mouth 2 (two) times daily.   20 tablet   0   . ibuprofen (ADVIL,MOTRIN) 800 MG tablet   Oral   Take 1 tablet (800 mg total) by mouth every 8 (eight) hours as needed.   30 tablet   0   . lidocaine (XYLOCAINE) 2 % solution   Mouth/Throat   Use as directed 20 mLs in the mouth or throat as needed for mouth pain.   100 mL   0   . traMADol (ULTRAM) 50 MG tablet   Oral   Take 1 tablet (50 mg total) by mouth every 6 (six) hours as needed.   20 tablet   0     Allergies Review of patient's allergies indicates no known allergies.  History reviewed. No pertinent family history.  Social History Social History  Substance Use Topics  . Smoking status: Current Every Day Smoker -- 0.50 packs/day    Types: Cigarettes  . Smokeless tobacco: Never Used  . Alcohol Use: 0.6 oz/week    1 Cans of beer per week    Review of Systems Constitutional: No fever/chills Eyes: No visual changes. ENT: Positive dental pain with dental caries. Cardiovascular: Denies chest pain. Respiratory: Denies shortness of breath. Gastrointestinal: No abdominal pain.  No nausea,  no vomiting.  No diarrhea.  No constipation. Genitourinary: Negative for dysuria. Musculoskeletal: Negative for back pain. Skin: Negative for rash. Neurological: Negative for headaches, focal weakness or numbness.  10-point ROS otherwise negative.  ____________________________________________ BP 121/71 mmHg  Pulse 84  Temp(Src) 98.7 F (37.1 C) (Oral)  Resp 18  Ht  (1.626 m)  Wt 135 lb (61.236 kg)  BMI 23.16 kg/m2  SpO2 100%   PHYSICAL EXAM:  VITAL SIGNS: ED Triage Vitals  Enc Vitals Group     BP --      Pulse --      Resp --      Temp --      Temp src --      SpO2 --      Weight --      Height --      Head Cir --      Peak Flow --      Pain Score --      Pain Loc --      Pain Edu? --      Excl. in GC? --     Constitutional: Alert and oriented. Well appearing and in no acute distress. Eyes: Conjunctivae are normal. PERRL. EOMI. Head: Atraumatic. Nose: No congestion/rhinnorhea. Mouth/Throat: Mucous membranes are moist.  Oropharynx non-erythematous. Obvious dental caries with erythematous comes. Multiple  dental caries with fractured teeth. Neck: No stridor.   Cardiovascular: Normal rate, regular rhythm. Grossly normal heart sounds.  Good peripheral circulation. Respiratory: Normal respiratory effort.  No retractions. Lungs CTAB. Musculoskeletal: No lower extremity tenderness nor edema.  No joint effusions. Neurologic:  Normal speech and language. No gross focal neurologic deficits are appreciated. No gait instability. Skin:  Skin is warm, dry and intact. No rash noted. Psychiatric: Mood and affect are normal. Speech and behavior are normal.  ____________________________________________   LABS (all labs ordered are listed, but only abnormal results are displayed)  Labs Reviewed - No data to display   PROCEDURES  Procedure(s) performed: None  Critical Care performed: No  ____________________________________________   INITIAL IMPRESSION /  ASSESSMENT AND PLAN / ED COURSE  Pertinent labs & imaging results that were available during my care of the patient were reviewed by me and considered in my medical decision making (see chart for details).  Acute dental caries. Rx given for amoxicillin 500 mg 3 times a day #30. Motrin 800 mg 3 times a day #30, tramadol 50 mg 1-2 every 6-8 hours #20. Patient follow-up with PCP or a list of dental providers was given to him. ____________________________________________   FINAL CLINICAL IMPRESSION(S) / ED DIAGNOSES  Final diagnoses:  Pain due to dental caries      Evangeline Dakinharles M Terrance Lanahan, PA-C 07/16/15 1740  Phineas SemenGraydon Goodman, MD 07/16/15 1952

## 2015-07-16 NOTE — ED Notes (Signed)
E-signature not obtained.  Computer does not work.

## 2015-10-30 ENCOUNTER — Encounter: Payer: Self-pay | Admitting: *Deleted

## 2015-10-30 ENCOUNTER — Emergency Department
Admission: EM | Admit: 2015-10-30 | Discharge: 2015-10-30 | Disposition: A | Discharge status: Home / Self Care | Payer: Self-pay | Attending: Emergency Medicine | Admitting: Emergency Medicine

## 2015-10-30 DIAGNOSIS — K0381 Cracked tooth: Secondary | ICD-10-CM | POA: Insufficient documentation

## 2015-10-30 DIAGNOSIS — S025XXA Fracture of tooth (traumatic), initial encounter for closed fracture: Secondary | ICD-10-CM

## 2015-10-30 DIAGNOSIS — F17210 Nicotine dependence, cigarettes, uncomplicated: Secondary | ICD-10-CM | POA: Insufficient documentation

## 2015-10-30 DIAGNOSIS — K029 Dental caries, unspecified: Secondary | ICD-10-CM | POA: Insufficient documentation

## 2015-10-30 MED ORDER — HYDROCODONE-ACETAMINOPHEN 5-325 MG PO TABS
1.0000 | ORAL_TABLET | Freq: Once | ORAL | Status: AC
  Administered 2015-10-30: 1 via ORAL
  Filled 2015-10-30: qty 1

## 2015-10-30 MED ORDER — IBUPROFEN 800 MG PO TABS: 800.0000 mg | ORAL_TABLET | Freq: Three times a day (TID) | ORAL | Status: DC | PRN

## 2015-10-30 MED ORDER — AMOXICILLIN 500 MG PO TABS: 500.0000 mg | ORAL_TABLET | Freq: Three times a day (TID) | ORAL | Status: DC

## 2015-10-30 MED ORDER — TRAMADOL HCL 50 MG PO TABS: 50.0000 mg | ORAL_TABLET | Freq: Four times a day (QID) | ORAL | Status: AC | PRN

## 2015-10-30 NOTE — Discharge Instructions (Signed)
Dental Pain Dental pain may be caused by many things, including:  Tooth decay (cavities or caries). Cavities cause the nerve of your tooth to be open to air and hot or cold temperatures. This can cause pain or discomfort.  Abscess or infection. A dental abscess is an area that is full of infected pus from a bacterial infection in the inner part of the tooth (pulp). It usually happens at the end of the tooth's root.  Injury.  An unknown reason (idiopathic). Your pain may be mild or severe. It may only happen when:  You are chewing.  You are exposed to hot or cold temperature.  You are eating or drinking sugary foods or beverages, such as:  Soda.  Candy. Your pain may also be there all of the time. HOME CARE Watch your dental pain for any changes. Do these things to lessen your discomfort:  Take medicines only as told by your dentist.  If your dentist tells you to take an antibiotic medicine, finish all of it even if you start to feel better.  Keep all follow-up visits as told by your dentist. This is important.  Do not apply heat to the outside of your face.  Rinse your mouth or gargle with salt water if told by your dentist. This helps with pain and swelling.  You can make salt water by adding  tsp of salt to 1 cup of warm water.  Apply ice to the painful area of your face:  Put ice in a plastic bag.  Place a towel between your skin and the bag.  Leave the ice on for 20 minutes, 2-3 times per day.  Avoid foods or drinks that cause you pain, such as:  Very hot or very cold foods or drinks.  Sweet or sugary foods or drinks. GET HELP IF:  Your pain is not helped with medicines.  Your symptoms are worse.  You have new symptoms. GET HELP RIGHT AWAY IF:  You cannot open your mouth.  You are having trouble breathing or swallowing.  You have a fever.  Your face, neck, or jaw is puffy (swollen).   This information is not intended to replace advice given to  you by your health care provider. Make sure you discuss any questions you have with your health care provider.   Document Released: 03/08/2008 Document Revised: 02/04/2015 Document Reviewed: 09/16/2014 Elsevier Interactive Patient Education 2016 [employer]  Tooth Injuries Tooth injuries (tooth trauma) include cracked or broken teeth (fractures), teeth that have been moved out of place or dislodged (luxations), and knocked-out teeth (avulsions). A tooth injury often needs to be treated quickly to save the tooth. If it is not possible to save a tooth after an injury, the tooth may need to be removed (extracted). Tooth injuries may be caused by any force that is strong enough to chip, break, dislodge, or knock out a tooth. HOME CARE  Take medicines only as told by your dentist or doctor.  Keep all follow-up visits as told by your dentist or doctor. This is important.  Do not eat or chew on very hard objects. These include ice cubes, pens, pencils, hard candy, and popcorn kernels.  Do not clench or grind your teeth. Tell your dentist or doctor if you grind your teeth while you sleep.  Apply ice to your mouth near the injured tooth as told by your dentist or doctor.  Rinse your mouth with salt water as told by your dentist or doctor.  Do  not use your teeth to open packages.  Always wear mouth protection when you play contact sports. GET HELP IF:  You continue to have tooth pain after a tooth injury.  Your tooth hurts or feels bad (is sensitive) because of something hot or cold.  You develop swelling near your injured tooth.  You have a fever.  You are not able to open your jaw.  You are drooling and it is getting worse.   This information is not intended to replace advice given to you by your health care provider. Make sure you discuss any questions you have with your health care provider.   Document Released: 03/10/2010 Document Revised: 02/04/2015 Document Reviewed:  09/16/2014 Elsevier Interactive Patient Education [employer]

## 2015-10-30 NOTE — ED Provider Notes (Signed)
[facility] Emergency Department Provider Note  ____________________________________________  Time seen: Approximately 6:37 PM  I have reviewed the triage vital signs and the nursing notes.   HISTORY  Chief Complaint Dental Pain    HPI Richard Tanner is a 30 y.o. male, NAD, presents to the emergency department with left lower dental pain caused by a broken tooth. States he was eating earlier this afternoon and noted a left lower tooth broke in the process. Has significant pain due to such. Has been seen by the [facility] in consultation but was unable to gain an appointment today. Denies any fevers, chills. No trauma or falls.  Past Medical History  Diagnosis Date  . Cardiac arrhythmia     There are no active problems to display for this patient.   History reviewed. No pertinent past surgical history.  Current Outpatient Rx  Name  Route  Sig  Dispense  Refill  . amoxicillin (AMOXIL) 500 MG tablet   Oral   Take 1 tablet (500 mg total) by mouth 3 (three) times daily with meals.   30 tablet   0   . ibuprofen (ADVIL,MOTRIN) 800 MG tablet   Oral   Take 1 tablet (800 mg total) by mouth every 8 (eight) hours as needed.   30 tablet   0   . traMADol (ULTRAM) 50 MG tablet   Oral   Take 1 tablet (50 mg total) by mouth every 6 (six) hours as needed.   20 tablet   0     Allergies Review of patient's allergies indicates no known allergies.  History reviewed. No pertinent family history.  Social History Social History  Substance Use Topics  . Smoking status: Current Every Day Smoker -- 0.50 packs/day    Types: Cigarettes  . Smokeless tobacco: Never Used  . Alcohol Use: 0.6 oz/week    1 Cans of beer per week     Review of Systems  Constitutional: No fever/chills ENT: Positive for tooth pain. No swelling about the mouth Cardiovascular: No chest pain. Respiratory: No shortness of breath Musculoskeletal: No jaw  pain Skin: Negative for redness, warmth Neurological: Negative for headaches, focal weakness or numbness. 10-point ROS otherwise negative.  ____________________________________________   PHYSICAL EXAM:  VITAL SIGNS: ED Triage Vitals  Enc Vitals Group     BP 10/30/15 1825 145/88 mmHg     Pulse Rate 10/30/15 1825 91     Resp 10/30/15 1825 18     Temp 10/30/15 1825 98.4 F (36.9 C)     Temp Source 10/30/15 1825 Oral     SpO2 10/30/15 1825 99 %     Weight 10/30/15 1825 130 lb (58.968 kg)     Height 10/30/15 1825  (1.6 m)     Head Cir --      Peak Flow --      Pain Score 10/30/15 1824 9     Pain Loc --      Pain Edu? --      Excl. in GC? --     Constitutional: Alert and oriented. Well appearing and in no acute distress. Eyes: Conjunctivae are normal. PERRL.   Head: Atraumatic. Neck:  Supple with full range of motion Hematological/Lymphatic/Immunilogical: No cervical lymphadenopathy. Cardiovascular: Normal rate, regular rhythm. Normal S1 and S2.   Respiratory: Normal respiratory effort without tachypnea or retractions. Lungs CTAB. Neurologic:  Normal speech and language. No gross focal neurologic deficits are appreciated.  Skin:  Skin is warm,  dry and intact. No rash noted. Psychiatric: Mood and affect are normal. Speech and behavior are normal. Patient exhibits appropriate insight and judgement.   ____________________________________________   LABS  None ____________________________________________  EKG  None ____________________________________________  RADIOLOGY  None ____________________________________________    PROCEDURES  Procedure(s) performed: None    Medications  HYDROcodone-acetaminophen (NORCO/VICODIN) 5-325 MG per tablet 1 tablet (1 tablet Oral Given 10/30/15 1845)     ____________________________________________   INITIAL IMPRESSION / ASSESSMENT AND PLAN / ED COURSE  Pertinent labs & imaging results that were available during  my care of the patient were reviewed by me and considered in my medical decision making (see chart for details).  Patient's diagnosis is consistent with broken tooth. Patient will be discharged home with prescriptions for amoxicillin to ensure no dental infection, Ultram to be used sparingly as needed for pain. Patient is to follow up with the [facility] dental school if symptoms persist past this treatment course. Patient is given ED precautions to return to the ED for any worsening or new symptoms.    ____________________________________________  FINAL CLINICAL IMPRESSION(S) / ED DIAGNOSES  Final diagnoses:  Broken tooth, closed, initial encounter  Dental caries      NEW MEDICATIONS STARTED DURING THIS VISIT:  New Prescriptions   AMOXICILLIN (AMOXIL) 500 MG TABLET    Take 1 tablet (500 mg total) by mouth 3 (three) times daily with meals.   IBUPROFEN (ADVIL,MOTRIN) 800 MG TABLET    Take 1 tablet (800 mg total) by mouth every 8 (eight) hours as needed.   TRAMADOL (ULTRAM) 50 MG TABLET    Take 1 tablet (50 mg total) by mouth every 6 (six) hours as needed.      Hope Pigeon, PA-C 10/30/15 1858  Rockne Menghini, MD 11/05/15 2251

## 2015-10-30 NOTE — ED Notes (Signed)
States broken tooth on left bottom of mouth

## 2016-01-31 ENCOUNTER — Emergency Department
Admission: EM | Admit: 2016-01-31 | Discharge: 2016-01-31 | Disposition: A | Discharge status: Home / Self Care | Payer: Self-pay | Attending: Emergency Medicine | Admitting: Emergency Medicine

## 2016-01-31 ENCOUNTER — Encounter: Payer: Self-pay | Admitting: Emergency Medicine

## 2016-01-31 DIAGNOSIS — F1910 Other psychoactive substance abuse, uncomplicated: Secondary | ICD-10-CM

## 2016-01-31 DIAGNOSIS — F17210 Nicotine dependence, cigarettes, uncomplicated: Secondary | ICD-10-CM | POA: Insufficient documentation

## 2016-01-31 DIAGNOSIS — F1290 Cannabis use, unspecified, uncomplicated: Secondary | ICD-10-CM | POA: Insufficient documentation

## 2016-01-31 DIAGNOSIS — K759 Inflammatory liver disease, unspecified: Secondary | ICD-10-CM | POA: Insufficient documentation

## 2016-01-31 DIAGNOSIS — R740 Nonspecific elevation of levels of transaminase and lactic acid dehydrogenase [LDH]: Secondary | ICD-10-CM

## 2016-01-31 DIAGNOSIS — Z79899 Other long term (current) drug therapy: Secondary | ICD-10-CM | POA: Insufficient documentation

## 2016-01-31 DIAGNOSIS — T401X1A Poisoning by heroin, accidental (unintentional), initial encounter: Secondary | ICD-10-CM

## 2016-01-31 DIAGNOSIS — Z791 Long term (current) use of non-steroidal anti-inflammatories (NSAID): Secondary | ICD-10-CM | POA: Insufficient documentation

## 2016-01-31 DIAGNOSIS — F1490 Cocaine use, unspecified, uncomplicated: Secondary | ICD-10-CM | POA: Insufficient documentation

## 2016-01-31 DIAGNOSIS — R7401 Elevation of levels of liver transaminase levels: Secondary | ICD-10-CM

## 2016-01-31 LAB — CBC WITH DIFFERENTIAL/PLATELET
Basophils Absolute: 0.1 10*3/uL (ref 0–0.1)
Basophils Relative: 1 %
EOS ABS: 0.5 10*3/uL (ref 0–0.7)
Eosinophils Relative: 6 %
HEMATOCRIT: 41.7 % (ref 40.0–52.0)
HEMOGLOBIN: 13.8 g/dL (ref 13.0–18.0)
LYMPHS ABS: 2.8 10*3/uL (ref 1.0–3.6)
Lymphocytes Relative: 34 %
MCH: 24.6 pg — AB (ref 26.0–34.0)
MCHC: 33 g/dL (ref 32.0–36.0)
MCV: 74.6 fL — ABNORMAL LOW (ref 80.0–100.0)
MONOS PCT: 11 %
Monocytes Absolute: 0.9 10*3/uL (ref 0.2–1.0)
NEUTROS ABS: 3.9 10*3/uL (ref 1.4–6.5)
NEUTROS PCT: 48 %
Platelets: 249 10*3/uL (ref 150–440)
RBC: 5.6 MIL/uL (ref 4.40–5.90)
RDW: 16.5 % — ABNORMAL HIGH (ref 11.5–14.5)
WBC: 8.2 10*3/uL (ref 3.8–10.6)

## 2016-01-31 LAB — COMPREHENSIVE METABOLIC PANEL
ALK PHOS: 86 U/L (ref 38–126)
ALT: 347 U/L — ABNORMAL HIGH (ref 17–63)
AST: 180 U/L — AB (ref 15–41)
Albumin: 4.1 g/dL (ref 3.5–5.0)
Anion gap: 7 (ref 5–15)
BILIRUBIN TOTAL: 1 mg/dL (ref 0.3–1.2)
BUN: 6 mg/dL (ref 6–20)
CALCIUM: 9.2 mg/dL (ref 8.9–10.3)
CO2: 27 mmol/L (ref 22–32)
Chloride: 104 mmol/L (ref 101–111)
Creatinine, Ser: 0.92 mg/dL (ref 0.61–1.24)
GFR calc Af Amer: >60 mL/min (ref >60)
Glucose, Bld: 86 mg/dL (ref 65–99)
POTASSIUM: 3.9 mmol/L (ref 3.5–5.1)
Sodium: 138 mmol/L (ref 135–145)
TOTAL PROTEIN: 7.8 g/dL (ref 6.5–8.1)

## 2016-01-31 LAB — URINE DRUG SCREEN, QUALITATIVE (ARMC ONLY)
Amphetamines, Ur Screen: NOT DETECTED
BARBITURATES, UR SCREEN: NOT DETECTED
BENZODIAZEPINE, UR SCRN: NOT DETECTED
CANNABINOID 50 NG, UR ~~LOC~~: POSITIVE — AB
Cocaine Metabolite,Ur ~~LOC~~: POSITIVE — AB
MDMA (ECSTASY) UR SCREEN: NOT DETECTED
Methadone Scn, Ur: NOT DETECTED
Opiate, Ur Screen: POSITIVE — AB
Phencyclidine (PCP) Ur S: NOT DETECTED
TRICYCLIC, UR SCREEN: NOT DETECTED

## 2016-01-31 LAB — ETHANOL

## 2016-01-31 LAB — TROPONIN I

## 2016-01-31 MED ORDER — SODIUM CHLORIDE 0.9 % IV SOLN
Freq: Once | INTRAVENOUS | Status: AC
  Administered 2016-01-31: 19:00:00 via INTRAVENOUS

## 2016-01-31 MED ORDER — NALOXONE HCL 2 MG/2ML IJ SOSY
2.0000 mg | PREFILLED_SYRINGE | Freq: Once | INTRAMUSCULAR | Status: AC
Start: 2016-01-31 — End: 2016-01-31
  Administered 2016-01-31: 2 mg via INTRAVENOUS
  Filled 2016-01-31: qty 2

## 2016-01-31 NOTE — ED Notes (Signed)
MD at bedside. 

## 2016-01-31 NOTE — ED Provider Notes (Addendum)
[facility] Emergency Department Provider Note        Time seen: ----------------------------------------- 6:02 PM on 01/31/2016 -----------------------------------------    I have reviewed the triage vital signs and the nursing notes.   HISTORY  Chief Complaint Loss of Consciousness and Drug Overdose    HPI Richard Tanner is a 30 y.o. male who presents to ER after passing on his car using heroin. Patient told police he used a smaller than usual mouth and hour ago that 3 hours prior he used heroin and cocaine. Patient states 3 days ago he had chest pain and felt drunk even though is perfectly sober, currently it has some chest pain at this time. Patient denies any recent illness or other drug use.   Past Medical History  Diagnosis Date  . Cardiac arrhythmia     There are no active problems to display for this patient.   History reviewed. No pertinent past surgical history.  Allergies Review of patient's allergies indicates no known allergies.  Social History Social History  Substance Use Topics  . Smoking status: Current Every Day Smoker -- 0.50 packs/day    Types: Cigarettes  . Smokeless tobacco: Never Used  . Alcohol Use: 0.6 oz/week    1 Cans of beer per week    Review of Systems Constitutional: Negative for fever. Eyes: Negative for visual changes. ENT: Negative for sore throat. Cardiovascular: Positive for chest pain Respiratory: Negative for shortness of breath. Gastrointestinal: Negative for abdominal pain, vomiting and diarrhea. Genitourinary: Negative for dysuria. Musculoskeletal: Negative for back pain. Skin: Negative for rash. Neurological: Negative for headaches, positive for weakness  10-point ROS otherwise negative.  ____________________________________________   PHYSICAL EXAM:  VITAL SIGNS: ED Triage Vitals  Enc Vitals Group     BP 01/31/16 1754 130/89 mmHg     Pulse Rate 01/31/16 1753 67     Resp  01/31/16 1753 8     Temp 01/31/16 1754 99.2 F (37.3 C)     Temp src --      SpO2 01/31/16 1753 99 %     Weight 01/31/16 1753 130 lb (58.968 kg)     Height 01/31/16 1753 5\' 3"  (1.6 m)     Head Cir --      Peak Flow --      Pain Score 01/31/16 1754 0     Pain Loc --      Pain Edu? --      Excl. in GC? --    Constitutional: Alert and oriented. Drowsy, no acute distress Eyes: Conjunctivae are normal. PERRL. Normal extraocular movements. ENT   Head: Normocephalic and atraumatic.   Nose: No congestion/rhinnorhea.   Mouth/Throat: Mucous membranes are moist.   Neck: No stridor. Cardiovascular: Normal rate, regular rhythm. No murmurs, rubs, or gallops. Respiratory: Normal respiratory effort without tachypnea nor retractions. Breath sounds are clear and equal bilaterally. No wheezes/rales/rhonchi. Gastrointestinal: Soft and nontender. Normal bowel sounds Musculoskeletal: Nontender with normal range of motion in all extremities. No lower extremity tenderness nor edema. Neurologic:  Normal speech and language. No gross focal neurologic deficits are appreciated.  Skin:  Skin is warm, dry and intact. No rash noted. Psychiatric: Mood and affect are normal. Speech and behavior are normal.  ____________________________________________  EKG: Interpreted by me.Sinus rhythm with a rate of 72 bpm, normal PR interval, normal QRS, normal QT interval. Normal axis. J-point elevation is noted  ____________________________________________  ED COURSE:  Pertinent labs & imaging results that were available during my care  of the patient were reviewed by me and considered in my medical decision making (see chart for details). Patient presents after an overdose. I will check basic labs, give additional Narcan and reevaluate. ____________________________________________    LABS (pertinent positives/negatives)  Labs Reviewed  CBC WITH DIFFERENTIAL/PLATELET - Abnormal; Notable for the following:     MCV 74.6 (*)    MCH 24.6 (*)    RDW 16.5 (*)    All other components within normal limits  COMPREHENSIVE METABOLIC PANEL - Abnormal; Notable for the following:    AST 180 (*)    ALT 347 (*)    All other components within normal limits  URINE DRUG SCREEN, QUALITATIVE ([facility] ONLY) - Abnormal; Notable for the following:    Cocaine Metabolite,Ur [place] POSITIVE (*)    Opiate, Ur Screen POSITIVE (*)    Cannabinoid 50 Ng, Ur [place] POSITIVE (*)    All other components within normal limits  ETHANOL  TROPONIN I   ____________________________________________  FINAL ASSESSMENT AND PLAN  Heroin overdose, Polysubstance abuse, Hepatitis  Plan: Patient with labs as dictated above. Patient's vital signs here have been stable, he was given a dose of Narcan here and I have sent off for a viral hepatitis panel. He is stable for discharge and denies detox referral at this time.    Emily Filbert, MD   Note: This dictation was prepared with Dragon dictation. Any transcriptional errors that result from this process are unintentional   Emily Filbert, MD 01/31/16 1843  Emily Filbert, MD 01/31/16 [phone]

## 2016-01-31 NOTE — Discharge Instructions (Signed)
Accidental Overdose °A drug overdose occurs when a chemical substance (drug or medication) is used in amounts large enough to overcome a person. This may result in severe illness or death. This is a type of poisoning. Accidental overdoses of medications or other substances come from a variety of reasons. When this happens accidentally, it is often because the person taking the substance does not know enough about what they have taken. Drugs which commonly cause overdose deaths are alcohol, psychotropic medications (medications which affect the mind), pain medications, illegal drugs (street drugs) such as cocaine and heroin, and multiple drugs taken at the same time. It may result from careless behavior (such as over-indulging at a party). Other causes of overdose may include multiple drug use, a lapse in memory, or drug use after a period of no drug use.  °Sometimes overdosing occurs because a person cannot remember if they have taken their medication.  °A common unintentional overdose in young children involves multi-vitamins containing iron. Iron is a part of the hemoglobin molecule in blood. It is used to transport oxygen to living cells. When taken in small amounts, iron allows the body to restock hemoglobin. In large amounts, it causes problems in the body. If this overdose is not treated, it can lead to death. °Never take medicines that show signs of tampering or do not seem quite right. Never take medicines in the dark or in poor lighting. Read the label and check each dose of medicine before you take it. When adults are poisoned, it happens most often through carelessness or lack of information. Taking medicines in the dark or taking medicine prescribed for someone else to treat the same type of problem is a dangerous practice. °SYMPTOMS  °Symptoms of overdose depend on the medication and amount taken. They can vary from over-activity with stimulant over-dosage, to sleepiness from depressants such as  alcohol, narcotics and tranquilizers. Confusion, dizziness, nausea and vomiting may be present. If problems are severe enough coma and death may result. °DIAGNOSIS  °Diagnosis and management are generally straightforward if the drug is known. Otherwise it is more difficult. At times, certain symptoms and signs exhibited by the patient, or blood tests, can reveal the drug in question.  °TREATMENT  °In an emergency department, most patients can be treated with supportive measures. Antidotes may be available if there has been an overdose of opioids or benzodiazepines. A rapid improvement will often occur if this is the cause of overdose. °At home or away from medical care: °· There may be no immediate problems or warning signs in children. °· Not everything works well in all cases of poisoning. °· Take immediate action. Poisons may act quickly. °· If you think someone has swallowed medicine or a household product, and the person is unconscious, having seizures (convulsions), or is not breathing, immediately call for an ambulance. °IF a person is conscious and appears to be doing OK but has swallowed a poison: °· Do not wait to see what effect the poison will have. Immediately call a poison control center (listed in the white pages of your telephone book under "Poison Control" or inside the front cover with other emergency numbers). Some poison control centers have TTY capability for the deaf. Check with your local center if you or someone in your family requires this service. °· Keep the container so you can read the label on the product for ingredients. °· Describe what, when, and how much was taken and the age and condition of the person poisoned.   Inform them if the person is vomiting, choking, drowsy, shows a change in color or temperature of skin, is conscious or unconscious, or is convulsing.  Do not cause vomiting unless instructed by medical personnel. Do not induce vomiting or force liquids into a person who  is convulsing, unconscious, or very drowsy. Stay calm and in control.   Activated charcoal also is sometimes used in certain types of poisoning and you may wish to add a supply to your emergency medicines. It is available without a prescription. Call a poison control center before using this medication. PREVENTION  Thousands of children die every year from unintentional poisoning. This may be from household chemicals, poisoning from carbon monoxide in a car, taking their parent's medications, or simply taking a few iron pills or vitamins with iron. Poisoning comes from unexpected sources.  Store medicines out of the sight and reach of children, preferably in a locked cabinet. Do not keep medications in a food cabinet. Always store your medicines in a secure place. Get rid of expired medications.  If you have children living with you or have them as occasional guests, you should have child-resistant caps on your medicine containers. Keep everything out of reach. Child proof your home.  If you are called to the telephone or to answer the door while you are taking a medicine, take the container with you or put the medicine out of the reach of small children.  Do not take your medication in front of children. Do not tell your child how good a medication is and how good it is for them. They may get the idea it is more of a treat.  If you are an adult and have accidentally taken an overdose, you need to consider how this happened and what can be done to prevent it from happening again. If this was from a street drug or alcohol, determine if there is a problem that needs addressing. If you are not sure a problems exists, it is easy to talk to a professional and ask them if they think you have a problem. It is better to handle this problem in this way before it happens again and has a much worse consequence.   This information is not intended to replace advice given to you by your health care provider. Make  sure you discuss any questions you have with your health care provider.   Document Released: 12/04/2004 Document Revised: 10/11/2014 Document Reviewed: 03/10/2015 Elsevier Interactive Patient Education 2016 [employer] Polysubstance Abuse When people abuse more than one drug or type of drug it is called polysubstance or polydrug abuse. For example, many smokers also drink alcohol. This is one form of polydrug abuse. Polydrug abuse also refers to the use of a drug to counteract an unpleasant effect produced by another drug. It may also be used to help with withdrawal from another drug. People who take stimulants may become agitated. Sometimes this agitation is countered with a tranquilizer. This helps protect against the unpleasant side effects. Polydrug abuse also refers to the use of different drugs at the same time.  Anytime drug use is interfering with normal living activities, it has become abuse. This includes problems with family and friends. Psychological dependence has developed when your mind tells you that the drug is needed. This is usually followed by physical dependence which has developed when continuing increases of drug are required to get the same feeling or "high". This is known as addiction or chemical dependency. A person's risk  risk is much higher if there is a history of chemical dependency in the family. °SIGNS OF CHEMICAL DEPENDENCY °· You have been told by friends or family that drugs have become a problem. °· You fight when using drugs. °· You are having blackouts (not remembering what you do while using). °· You feel sick from using drugs but continue using. °· You lie about use or amounts of drugs (chemicals) used. °· You need chemicals to get you going. °· You are suffering in work performance or in school because of drug use. °· You get sick from use of drugs but continue to use anyway. °· You need drugs to relate to people or feel comfortable in social situations. °· You use  drugs to forget problems. °"Yes" answered to any of the above signs of chemical dependency indicates there are problems. The longer the use of drugs continues, the greater the problems will become. °If there is a family history of drug or alcohol use, it is best not to experiment with these drugs. Continual use leads to tolerance. After tolerance develops more of the drug is needed to get the same feeling. This is followed by addiction. With addiction, drugs become the most important part of life. It becomes more important to take drugs than participate in the other usual activities of life. This includes relating to friends and family. Addiction is followed by dependency. Dependency is a condition where drugs are now needed not just to get high, but to feel normal. °Addiction cannot be cured but it can be stopped. This often requires outside help and the care of professionals. Treatment centers are listed in the yellow pages under: Cocaine, Narcotics, and Alcoholics Anonymous. Most hospitals and clinics can refer you to a specialized care center. Talk to your caregiver if you need help. °  °This information is not intended to replace advice given to you by your health care provider. Make sure you discuss any questions you have with your health care provider. °  °Document Released: 05/12/2005 Document Revised: 12/13/2011 Document Reviewed: 09/25/2014 °Elsevier Interactive Patient Education ©2016 [employer] Inc[employer] ° °

## 2016-01-31 NOTE — ED Notes (Signed)
Pt arrived by EMS after passing out in car after using heroin. Pt told police he used a smaller than usual amount about an hour ago and that 3 hours prior he used heroine and cocaine.  Pt states 3 days ago he had chest pain and felt drunk even though he was "perfectly sober".

## 2016-02-02 LAB — HEPATITIS PANEL, ACUTE
HEP A IGM: NEGATIVE
Hep B C IgM: NEGATIVE
Hepatitis B Surface Ag: NEGATIVE

## 2016-02-03 ENCOUNTER — Telehealth: Payer: Self-pay | Admitting: Emergency Medicine

## 2016-02-03 NOTE — ED Notes (Signed)
Called patient to inform of positive hep c testing and need for follow up tests.  After identifying myself the phone went to a message (or person?) saying to "go kill yourself .Marland Kitchen.... Profanities.)  I was unable to leave a message.

## 2016-02-11 ENCOUNTER — Emergency Department
Admission: EM | Admit: 2016-02-11 | Discharge: 2016-02-11 | Disposition: A | Discharge status: Home / Self Care | Payer: Self-pay | Attending: Emergency Medicine | Admitting: Emergency Medicine

## 2016-02-11 ENCOUNTER — Emergency Department: Payer: Self-pay

## 2016-02-11 DIAGNOSIS — R079 Chest pain, unspecified: Secondary | ICD-10-CM | POA: Insufficient documentation

## 2016-02-11 DIAGNOSIS — Z792 Long term (current) use of antibiotics: Secondary | ICD-10-CM | POA: Insufficient documentation

## 2016-02-11 DIAGNOSIS — F17210 Nicotine dependence, cigarettes, uncomplicated: Secondary | ICD-10-CM | POA: Insufficient documentation

## 2016-02-11 DIAGNOSIS — F1290 Cannabis use, unspecified, uncomplicated: Secondary | ICD-10-CM | POA: Insufficient documentation

## 2016-02-11 LAB — CBC
HCT: 39.5 % — ABNORMAL LOW (ref 40.0–52.0)
HEMOGLOBIN: 13.2 g/dL (ref 13.0–18.0)
MCH: 25 pg — AB (ref 26.0–34.0)
MCHC: 33.4 g/dL (ref 32.0–36.0)
MCV: 74.9 fL — ABNORMAL LOW (ref 80.0–100.0)
Platelets: 254 10*3/uL (ref 150–440)
RBC: 5.28 MIL/uL (ref 4.40–5.90)
RDW: 16.8 % — ABNORMAL HIGH (ref 11.5–14.5)
WBC: 10.7 10*3/uL — AB (ref 3.8–10.6)

## 2016-02-11 LAB — BASIC METABOLIC PANEL
ANION GAP: 10 (ref 5–15)
BUN: <5 mg/dL — ABNORMAL LOW (ref 6–20)
CALCIUM: 9.4 mg/dL (ref 8.9–10.3)
CO2: 25 mmol/L (ref 22–32)
Chloride: 103 mmol/L (ref 101–111)
Creatinine, Ser: 1.05 mg/dL (ref 0.61–1.24)
Glucose, Bld: 150 mg/dL — ABNORMAL HIGH (ref 65–99)
Potassium: 3.7 mmol/L (ref 3.5–5.1)
Sodium: 138 mmol/L (ref 135–145)

## 2016-02-11 LAB — TROPONIN I

## 2016-02-11 MED ORDER — ASPIRIN 81 MG PO CHEW
324.0000 mg | CHEWABLE_TABLET | Freq: Once | ORAL | Status: AC
  Administered 2016-02-11: 324 mg via ORAL

## 2016-02-11 MED ORDER — ASPIRIN 81 MG PO CHEW
CHEWABLE_TABLET | ORAL | Status: AC
  Administered 2016-02-11: 324 mg via ORAL
  Filled 2016-02-11: qty 4

## 2016-02-11 NOTE — ED Notes (Signed)
X 1 unsuccessful IV insertion attempt.

## 2016-02-11 NOTE — ED Notes (Signed)
Pt in with cold symptoms since yest with co shob, no distress noted in triage.

## 2016-02-11 NOTE — ED Notes (Signed)
Pt resting in bed with no complaints, states he is able to catch his breath better now.

## 2016-02-11 NOTE — ED Provider Notes (Signed)
-----------------------------------------   10:53 AM on 02/11/2016 -----------------------------------------  I reviewed his chest x-ray which I suspect is more consistent with atelectasis, he has had no cough, no fever, no hypoxia, no increased work of breathing.  Gayla DossEryka A Valeria Boza, MD 02/11/16 1053

## 2016-02-11 NOTE — ED Provider Notes (Signed)
-----------------------------------------   10:37 AM on 02/11/2016 -----------------------------------------  Care was assumed from Dr. Manson PasseyBrown at 7 AM pending results of second troponin which is negative. The patient reports complete resolution of his chest pain. He is lying in bed, has no complaints. We'll DC with return precautions, close PCP follow-up. He is comfortable with the discharge plan.  Gayla DossEryka A Rocio Wolak, MD 02/11/16 1037

## 2016-02-11 NOTE — ED Notes (Signed)
Pt present to ED with c/o mid chest pain since this morning, reports using cocaine last night. Pt reports chest pain woke him up this morning, also c/o shortness of breath. Pt denies diaphoresis, nausea, vomiting, or abdominal pain. Pt skin warm and dry, respirations even and unlabored, call bell within reach. Pt placed on cardiac monitor. Will continue to monitor. Dr. Manson PasseyBrown at bedside.

## 2016-02-11 NOTE — ED Provider Notes (Signed)
[facility] Emergency Department Provider Note  ____________________________________________  Time seen: 5:00AM  I have reviewed the triage vital signs and the nursing notes.   HISTORY  Chief Complaint Shortness of Breath    HPI Richard Tanner is a 30 y.o. male presents with left-sided chest pain on awakening this morning. Patient states that he used IV cocaine last night at approximately 9 PM awakening this morning with left-sided chest pain 7 out of 10 that has been persistent since that time. Patient also admits to dyspnea no diaphoresis no nausea no dizziness.   Past Medical History  Diagnosis Date  . Cardiac arrhythmia     There are no active problems to display for this patient.   No past surgical history on file.  Current Outpatient Rx  Name  Route  Sig  Dispense  Refill  . amoxicillin (AMOXIL) 500 MG tablet   Oral   Take 1 tablet (500 mg total) by mouth 3 (three) times daily with meals.   30 tablet   0   . ibuprofen (ADVIL,MOTRIN) 800 MG tablet   Oral   Take 1 tablet (800 mg total) by mouth every 8 (eight) hours as needed.   30 tablet   0   . traMADol (ULTRAM) 50 MG tablet   Oral   Take 1 tablet (50 mg total) by mouth every 6 (six) hours as needed.   20 tablet   0     Allergies No  No family history on file.  Social History Social History  Substance Use Topics  . Smoking status: Current Every Day Smoker -- 0.50 packs/day    Types: Cigarettes  . Smokeless tobacco: Never Used  . Alcohol Use: 0.6 oz/week    1 Cans of beer per week    Review of Systems  Constitutional: Negative for fever. Eyes: Negative for visual changes. ENT: Negative for sore throat. Cardiovascular: Positive for chest pain. Respiratory: Positive for shortness of breath. Gastrointestinal: Negative for abdominal pain, vomiting and diarrhea. Genitourinary: Negative for dysuria. Musculoskeletal: Negative for back pain. Skin: Negative for  rash. Neurological: Negative for headaches, focal weakness or numbness.   10-point ROS otherwise negative.  ____________________________________________   PHYSICAL EXAM:  VITAL SIGNS: ED Triage Vitals  Enc Vitals Group     BP 02/11/16 0450 112/74 mmHg     Pulse Rate 02/11/16 0449 107     Resp 02/11/16 0449 18     Temp 02/11/16 0449 98.3 F (36.8 C)     Temp Source 02/11/16 0449 Oral     SpO2 02/11/16 0449 99 %     Weight 02/11/16 0449 125 lb (56.7 kg)     Height 02/11/16 0449  (1.6 m)     Head Cir --      Peak Flow --      Pain Score 02/11/16 0449 6     Pain Loc --      Pain Edu? --      Excl. in GC? --     Constitutional: Alert and oriented. Well appearing and in no distress. Eyes: Conjunctivae are normal. PERRL. Normal extraocular movements. ENT   Head: Normocephalic and atraumatic.   Nose: No congestion/rhinnorhea.   Mouth/Throat: Mucous membranes are moist.   Neck: No stridor. Hematological/Lymphatic/Immunilogical: No cervical lymphadenopathy. Cardiovascular: Normal rate, regular rhythm. Normal and symmetric distal pulses are present in all extremities. No murmurs, rubs, or gallops. Respiratory: Normal respiratory effort without tachypnea nor retractions. Breath sounds are clear and equal bilaterally.  No wheezes/rales/rhonchi. Gastrointestinal: Soft and nontender. No distention. There is no CVA tenderness. Genitourinary: deferred Musculoskeletal: Nontender with normal range of motion in all extremities. No joint effusions.  No lower extremity tenderness nor edema. Neurologic:  Normal speech and language. No gross focal neurologic deficits are appreciated. Speech is normal.  Skin:  Skin is warm, dry and intact. No rash noted. Psychiatric: Mood and affect are normal. Speech and behavior are normal. Patient exhibits appropriate insight and judgment.  ____________________________________________    LABS (pertinent positives/negatives)  Labs  Reviewed  BASIC METABOLIC PANEL - Abnormal; Notable for the following:    Glucose, Bld 150 (*)    BUN <5 (*)    All other components within normal limits  CBC - Abnormal; Notable for the following:    WBC 10.7 (*)    HCT 39.5 (*)    MCV 74.9 (*)    MCH 25.0 (*)    RDW 16.8 (*)    All other components within normal limits  TROPONIN I     ____________________________________________   EKG  ED ECG REPORT I, [place] BROWN, the attending physician, personally viewed and interpreted this ECG.   Date: 02/11/2016  EKG Time: 5:14 a.m.  Rate: 89  Rhythm: Normal sinus rhythm  Axis: Normal  Intervals: Normal  ST&T Change:    ____________________________________________    RADIOLOGY      DG Chest Port 1 View (Final result) Result time: 02/11/16 06:05:44   Final result by Rad Results In Interface (02/11/16 06:05:44)   Narrative:   CLINICAL DATA: Initial evaluation for acute chest pain, dyspnea.  EXAM: PORTABLE CHEST 1 VIEW  COMPARISON: Prior study from 11/14/2014.  FINDINGS: The cardiac and mediastinal silhouettes are stable in size and contour, and remain within normal limits.  The lungs are normally inflated. There is question of faint infiltrate within the mid left lung base. No other focal airspace disease. No pulmonary edema or pleural effusion. No pneumothorax.  No acute osseous abnormality identified.  IMPRESSION: 1. Faint patchy opacity within the left lung base, which may reflect atelectasis or infiltrate. 2. No other active cardiopulmonary disease.   Electronically Signed By: Rise MuBenjamin McClintock M.D. On: 02/11/2016 06:05        ECG Results        INITIAL IMPRESSION / ASSESSMENT AND PLAN / ED COURSE  Pertinent labs & imaging results that were available during my care of the patient were reviewed by me and considered in my medical decision making (see chart for details).  Patient with chest pain status post IV cocaine use.  Initial EKG revealed no evidence of ST segment elevation, cardiac enzymes negative 1. Will obtain a second troponin level and if negative I believe the patient is safe for discharge home. I discussed the patient with Dr. Claris GladdenGale who assumed the patient's care  ____________________________________________   FINAL CLINICAL IMPRESSION(S) / ED DIAGNOSES  Final diagnoses:  Chest pain, unspecified chest pain type      Darci Currentandolph N Brown, MD 02/12/16 [phone]

## 2016-02-11 NOTE — Discharge Instructions (Signed)
Nonspecific Chest Pain  °Chest pain can be caused by many different conditions. There is always a chance that your pain could be related to something serious, such as a heart attack or a blood clot in your lungs. Chest pain can also be caused by conditions that are not life-threatening. If you have chest pain, it is very important to follow up with your health care provider. °CAUSES  °Chest pain can be caused by: °· Heartburn. °· Pneumonia or bronchitis. °· Anxiety or stress. °· Inflammation around your heart (pericarditis) or lung (pleuritis or pleurisy). °· A blood clot in your lung. °· A collapsed lung (pneumothorax). It can develop suddenly on its own (spontaneous pneumothorax) or from trauma to the chest. °· Shingles infection (varicella-zoster virus). °· Heart attack. °· Damage to the bones, muscles, and cartilage that make up your chest wall. This can include: °¨ Bruised bones due to injury. °¨ Strained muscles or cartilage due to frequent or repeated coughing or overwork. °¨ Fracture to one or more ribs. °¨ Sore cartilage due to inflammation (costochondritis). °RISK FACTORS  °Risk factors for chest pain may include: °· Activities that increase your risk for trauma or injury to your chest. °· Respiratory infections or conditions that cause frequent coughing. °· Medical conditions or overeating that can cause heartburn. °· Heart disease or family history of heart disease. °· Conditions or health behaviors that increase your risk of developing a blood clot. °· Having had chicken pox (varicella zoster). °SIGNS AND SYMPTOMS °Chest pain can feel like: °· Burning or tingling on the surface of your chest or deep in your chest. °· Crushing, pressure, aching, or squeezing pain. °· Dull or sharp pain that is worse when you move, cough, or take a deep breath. °· Pain that is also felt in your back, neck, shoulder, or arm, or pain that spreads to any of these areas. °Your chest pain may come and go, or it may stay  constant. °DIAGNOSIS °Lab tests or other studies may be needed to find the cause of your pain. Your health care provider may have you take a test called an ambulatory ECG (electrocardiogram). An ECG records your heartbeat patterns at the time the test is performed. You may also have other tests, such as: °· Transthoracic echocardiogram (TTE). During echocardiography, sound waves are used to create a picture of all of the heart structures and to look at how blood flows through your heart. °· Transesophageal echocardiogram (TEE). This is a more advanced imaging test that obtains images from inside your body. It allows your health care provider to see your heart in finer detail. °· Cardiac monitoring. This allows your health care provider to monitor your heart rate and rhythm in real time. °· Holter monitor. This is a portable device that records your heartbeat and can help to diagnose abnormal heartbeats. It allows your health care provider to track your heart activity for several days, if needed. °· Stress tests. These can be done through exercise or by taking medicine that makes your heart beat more quickly. °· Blood tests. °· Imaging tests. °TREATMENT  °Your treatment depends on what is causing your chest pain. Treatment may include: °· Medicines. These may include: °¨ Acid blockers for heartburn. °¨ Anti-inflammatory medicine. °¨ Pain medicine for inflammatory conditions. °¨ Antibiotic medicine, if an infection is present. °¨ Medicines to dissolve blood clots. °¨ Medicines to treat coronary artery disease. °· Supportive care for conditions that do not require medicines. This may include: °¨ Resting. °¨ Applying heat   or cold packs to injured areas. °¨ Limiting activities until pain decreases. °HOME CARE INSTRUCTIONS °· If you were prescribed an antibiotic medicine, finish it all even if you start to feel better. °· Avoid any activities that bring on chest pain. °· Do not use any tobacco products, including  cigarettes, chewing tobacco, or electronic cigarettes. If you need help quitting, ask your health care provider. °· Do not drink alcohol. °· Take medicines only as directed by your health care provider. °· Keep all follow-up visits as directed by your health care provider. This is important. This includes any further testing if your chest pain does not go away. °· If heartburn is the cause for your chest pain, you may be told to keep your head raised (elevated) while sleeping. This reduces the chance that acid will go from your stomach into your esophagus. °· Make lifestyle changes as directed by your health care provider. These may include: °¨ Getting regular exercise. Ask your health care provider to suggest some activities that are safe for you. °¨ Eating a heart-healthy diet. A registered dietitian can help you to learn healthy eating options. °¨ Maintaining a healthy weight. °¨ Managing diabetes, if necessary. °¨ Reducing stress. °SEEK MEDICAL CARE IF: °· Your chest pain does not go away after treatment. °· You have a rash with blisters on your chest. °· You have a fever. °SEEK IMMEDIATE MEDICAL CARE IF:  °· Your chest pain is worse. °· You have an increasing cough, or you cough up blood. °· You have severe abdominal pain. °· You have severe weakness. °· You faint. °· You have chills. °· You have sudden, unexplained chest discomfort. °· You have sudden, unexplained discomfort in your arms, back, neck, or jaw. °· You have shortness of breath at any time. °· You suddenly start to sweat, or your skin gets clammy. °· You feel nauseous or you vomit. °· You suddenly feel light-headed or dizzy. °· Your heart begins to beat quickly, or it feels like it is skipping beats. °These symptoms may represent a serious problem that is an emergency. Do not wait to see if the symptoms will go away. Get medical help right away. Call your local emergency services (911 in the [place] Do not drive yourself to the hospital. °  °This  information is not intended to replace advice given to you by your health care provider. Make sure you discuss any questions you have with your health care provider. °  °Document Released: 06/30/2005 Document Revised: 10/11/2014 Document Reviewed: 04/26/2014 °Elsevier Interactive Patient Education ©2016 [employer] ° °

## 2016-03-06 ENCOUNTER — Emergency Department
Admission: EM | Admit: 2016-03-06 | Discharge: 2016-03-06 | Disposition: A | Discharge status: Home / Self Care | Payer: Self-pay | Attending: Emergency Medicine | Admitting: Emergency Medicine

## 2016-03-06 ENCOUNTER — Encounter: Payer: Self-pay | Admitting: Emergency Medicine

## 2016-03-06 DIAGNOSIS — F1410 Cocaine abuse, uncomplicated: Secondary | ICD-10-CM | POA: Insufficient documentation

## 2016-03-06 DIAGNOSIS — T40601A Poisoning by unspecified narcotics, accidental (unintentional), initial encounter: Secondary | ICD-10-CM | POA: Insufficient documentation

## 2016-03-06 DIAGNOSIS — F1290 Cannabis use, unspecified, uncomplicated: Secondary | ICD-10-CM | POA: Insufficient documentation

## 2016-03-06 DIAGNOSIS — Z792 Long term (current) use of antibiotics: Secondary | ICD-10-CM | POA: Insufficient documentation

## 2016-03-06 DIAGNOSIS — F17210 Nicotine dependence, cigarettes, uncomplicated: Secondary | ICD-10-CM | POA: Insufficient documentation

## 2016-03-06 LAB — COMPREHENSIVE METABOLIC PANEL
ALT: 198 U/L — ABNORMAL HIGH (ref 17–63)
AST: 121 U/L — AB (ref 15–41)
Albumin: 3.9 g/dL (ref 3.5–5.0)
Alkaline Phosphatase: 72 U/L (ref 38–126)
Anion gap: 5 (ref 5–15)
BILIRUBIN TOTAL: 0.6 mg/dL (ref 0.3–1.2)
BUN: 6 mg/dL (ref 6–20)
CHLORIDE: 105 mmol/L (ref 101–111)
CO2: 25 mmol/L (ref 22–32)
Calcium: 8.8 mg/dL — ABNORMAL LOW (ref 8.9–10.3)
Creatinine, Ser: 1.05 mg/dL (ref 0.61–1.24)
Glucose, Bld: 90 mg/dL (ref 65–99)
POTASSIUM: 3.2 mmol/L — AB (ref 3.5–5.1)
SODIUM: 135 mmol/L (ref 135–145)
TOTAL PROTEIN: 7.2 g/dL (ref 6.5–8.1)

## 2016-03-06 LAB — CBC
HCT: 36.1 % — ABNORMAL LOW (ref 40.0–52.0)
Hemoglobin: 12.2 g/dL — ABNORMAL LOW (ref 13.0–18.0)
MCH: 25.1 pg — ABNORMAL LOW (ref 26.0–34.0)
MCHC: 33.8 g/dL (ref 32.0–36.0)
MCV: 74.1 fL — ABNORMAL LOW (ref 80.0–100.0)
PLATELETS: 250 10*3/uL (ref 150–440)
RBC: 4.87 MIL/uL (ref 4.40–5.90)
RDW: 15 % — AB (ref 11.5–14.5)
WBC: 9.2 10*3/uL (ref 3.8–10.6)

## 2016-03-06 LAB — TROPONIN I

## 2016-03-06 MED ORDER — NALOXONE HCL 2 MG/2ML IJ SOSY
PREFILLED_SYRINGE | INTRAMUSCULAR | Status: AC
  Filled 2016-03-06: qty 2

## 2016-03-06 NOTE — ED Notes (Signed)
Pt arrived via ems from motel parking lot where he was found unconscious. 2 narcan administered, 1 through nasal and 1 through IV. Pt responsive to voice and touch. No respiratory distress upon assessment. Able to answer questions, admits use of heroin and cocaine intravenously.

## 2016-03-06 NOTE — Discharge Instructions (Signed)
Narcotic Overdose A narcotic overdose is the misuse or overuse of a narcotic drug. A narcotic overdose can make you pass out and stop breathing. If you are not treated right away, this can cause permanent brain damage or stop your heart. Medicine may be given to reverse the effects of an overdose. If so, this medicine may bring on withdrawal symptoms. The symptoms may be abdominal cramps, throwing up (vomiting), sweating, chills, and nervousness. Injecting narcotics can cause more problems than just an overdose. AIDS, hepatitis, and other very serious infections are transmitted by sharing needles and syringes. If you decide to quit using, there are medicines which can help you through the withdrawal period. Trying to quit all at once on your own can be uncomfortable, but not life-threatening. Call your caregiver, Narcotics Anonymous, or any drug and alcohol treatment program for further help.    This information is not intended to replace advice given to you by your health care provider. Make sure you discuss any questions you have with your health care provider.   Document Released: 10/28/2004 Document Revised: 10/11/2014 Document Reviewed: 03/12/2015 Elsevier Interactive Patient Education 2016 Elsevier Inc.  Stimulant Use Disorder-Cocaine Cocaine is one of a group of powerful drugs called stimulants. Cocaine has medical uses for stopping nosebleeds and for pain control before minor nose or dental surgery. However, cocaine is misused because of the effects that it produces. These effects include:   A feeling of extreme pleasure.  Alertness.  High energy. Common street names for cocaine include coke, crack, blow, snow, and nose candy. Cocaine is snorted, dissolved in water and injected, or smoked.  Stimulants are addictive because they activate regions of the brain that produce both the pleasurable sensation of "reward" and psychological dependence. Together, these actions account for loss of  control and the rapid development of drug dependence. This means you become ill without the drug (withdrawal) and need to keep using it to function.  Stimulant use disorder is use of stimulants that disrupts your daily life. It disrupts relationships with family and friends and how you do your job. Cocaine increases your blood pressure and heart rate. It can cause a heart attack or stroke. Cocaine can also cause death from irregular heart rate or seizures. SYMPTOMS Symptoms of stimulant use disorder with cocaine include:  Use of cocaine in larger amounts or over a longer period of time than intended.  Unsuccessful attempts to cut down or control cocaine use.  A lot of time spent obtaining, using, or recovering from the effects of cocaine.  A strong desire or urge to use cocaine (craving).  Continued use of cocaine in spite of major problems at work, school, or home because of use.  Continued use of cocaine in spite of relationship problems because of use.  Giving up or cutting down on important life activities because of cocaine use.  Use of cocaine over and over in situations when it is physically hazardous, such as driving a car.  Continued use of cocaine in spite of a physical problem that is likely related to use. Physical problems can include:  Malnutrition.  Nosebleeds.  Chest pain.  High blood pressure.  A hole that develops between the part of your nose that separates your nostrils (perforated nasal septum).  Lung and kidney damage.  Continued use of cocaine in spite of a mental problem that is likely related to use. Mental problems can include:  Schizophrenia-like symptoms.  Depression.  Bipolar mood swings.  Anxiety.  Sleep problems.  Need to use more and more cocaine to get the same effect, or lessened effect over time with use of the same amount of cocaine (tolerance).  Having withdrawal symptoms when cocaine use is stopped, or using cocaine to reduce or  avoid withdrawal symptoms. Withdrawal symptoms include:  Depressed or irritable mood.  Low energy or restlessness.  Bad dreams.  Poor or excessive sleep.  Increased appetite. DIAGNOSIS Stimulant use disorder is diagnosed by your health care provider. You may be asked questions about your cocaine use and how it affects your life. A physical exam may be done. A drug screen may be ordered. You may be referred to a mental health professional. The diagnosis of stimulant use disorder requires at least two symptoms within 12 months. The type of stimulant use disorder depends on the number of signs and symptoms you have. The type may be:  Mild. Two or three signs and symptoms.  Moderate. Four or five signs and symptoms.  Severe. Six or more signs and symptoms. TREATMENT Treatment for stimulant use disorder is usually provided by mental health professionals with training in substance use disorders. The following options are available:  Counseling or talk therapy. Talk therapy addresses the reasons you use cocaine and ways to keep you from using again. Goals of talk therapy include:  Identifying and avoiding triggers for use.  Handling cravings.  Replacing use with healthy activities.  Support groups. Support groups provide emotional support, advice, and guidance.  Medicine. Certain medicines may decrease cocaine cravings or withdrawal symptoms. HOME CARE INSTRUCTIONS  Take medicines only as directed by your health care provider.  Identify the people and activities that trigger your cocaine use and avoid them.  Keep all follow-up visits as directed by your health care provider. SEEK MEDICAL CARE IF:  Your symptoms get worse or you relapse.  You are not able to take medicines as directed. SEEK IMMEDIATE MEDICAL CARE IF:  You have serious thoughts about hurting yourself or others.  You have a seizure, chest pain, sudden weakness, or loss of speech or vision. FOR MORE  INFORMATION  National Institute on Drug Abuse: http://www.price-smith.com/www.drugabuse.gov  Substance Abuse and Mental Health Services Administration: SkateOasis.com.ptwww.samhsa.gov   This information is not intended to replace advice given to you by your health care provider. Make sure you discuss any questions you have with your health care provider.   Document Released: 09/17/2000 Document Revised: 10/11/2014 Document Reviewed: 10/03/2013 Elsevier Interactive Patient Education [employer]2016 Elsevier Inc.

## 2016-03-06 NOTE — ED Provider Notes (Signed)
[facility] Emergency Department Provider Note  ____________________________________________  Time seen: 1:50 AM  I have reviewed the triage vital signs and the nursing notes.   HISTORY  Chief Complaint Drug Overdose      HPI Richard Tanner is a 30 y.o. male presents via [facility] emergency services after being found unconscious in a motel parking lot. On their arrival patient noted to have pinpoint pupils, slow respirations as such 2 mg intranasal Narcan was given patient became responsive to voice and touch in addition 1 mg IV Narcan was given patient then alert and oriented. Patient admits intravenous heroin and cocaine tonight. Patient denies any suicidal ideation     Past Medical History  Diagnosis Date  . Cardiac arrhythmia     There are no active problems to display for this patient.   History reviewed. No pertinent past surgical history.  Current Outpatient Rx  Name  Route  Sig  Dispense  Refill  . amoxicillin (AMOXIL) 500 MG tablet   Oral   Take 1 tablet (500 mg total) by mouth 3 (three) times daily with meals.   30 tablet   0   . ibuprofen (ADVIL,MOTRIN) 800 MG tablet   Oral   Take 1 tablet (800 mg total) by mouth every 8 (eight) hours as needed.   30 tablet   0   . traMADol (ULTRAM) 50 MG tablet   Oral   Take 1 tablet (50 mg total) by mouth every 6 (six) hours as needed.   20 tablet   0     Allergies No known drug allergies No family history on file.  Social History Social History  Substance Use Topics  . Smoking status: Current Every Day Smoker -- 0.50 packs/day    Types: Cigarettes  . Smokeless tobacco: Never Used  . Alcohol Use: 0.6 oz/week    1 Cans of beer per week    Review of Systems  Constitutional: Negative for fever. Eyes: Negative for visual changes. ENT: Negative for sore throat. Cardiovascular: Negative for chest pain. Respiratory: Negative for shortness of  breath. Gastrointestinal: Negative for abdominal pain, vomiting and diarrhea. Genitourinary: Negative for dysuria. Musculoskeletal: Negative for back pain. Skin: Negative for rash. Neurological: Negative for headaches, focal weakness or numbness. Psychiatric: Polysubstance abuse  10-point ROS otherwise negative.  ____________________________________________   PHYSICAL EXAM:  VITAL SIGNS: ED Triage Vitals  Enc Vitals Group     BP 03/06/16 0147 131/85 mmHg     Pulse Rate 03/06/16 0147 92     Resp 03/06/16 0147 22     Temp 03/06/16 0147 97.5 F (36.4 C)     Temp Source 03/06/16 0147 Oral     SpO2 03/06/16 0147 97 %     Weight 03/06/16 0147 122 lb 2.2 oz (55.4 kg)     Height 03/06/16 0147 5\' 3"  (1.6 m)     Head Cir --      Peak Flow --      Pain Score --      Pain Loc --      Pain Edu? --      Excl. in GC? --    Constitutional: Alert and oriented. Well appearing and in no distress. Eyes: Conjunctivae are normal. PERRL. Normal extraocular movements. ENT   Head: Normocephalic and atraumatic.   Nose: No congestion/rhinnorhea.   Mouth/Throat: Mucous membranes are moist.   Neck: No stridor. Hematological/Lymphatic/Immunilogical: No cervical lymphadenopathy. Cardiovascular: Normal rate, regular rhythm. Normal and symmetric distal pulses are  present in all extremities. No murmurs, rubs, or gallops. Respiratory: Normal respiratory effort without tachypnea nor retractions. Breath sounds are clear and equal bilaterally. No wheezes/rales/rhonchi. Gastrointestinal: Soft and nontender. No distention. There is no CVA tenderness. Genitourinary: deferred Musculoskeletal: Nontender with normal range of motion in all extremities. No joint effusions.  No lower extremity tenderness nor edema. Neurologic:  Normal speech and language. No gross focal neurologic deficits are appreciated. Speech is normal.  Skin:  Skin is warm, dry and intact. No rash noted. Psychiatric: Mood and  affect are normal. Speech and behavior are normal. Patient exhibits appropriate insight and judgment.  ____________________________________________    LABS (pertinent positives/negatives)  Labs Reviewed  CBC - Abnormal; Notable for the following:    Hemoglobin 12.2 (*)    HCT 36.1 (*)    MCV 74.1 (*)    MCH 25.1 (*)    RDW 15.0 (*)    All other components within normal limits  COMPREHENSIVE METABOLIC PANEL - Abnormal; Notable for the following:    Potassium 3.2 (*)    Calcium 8.8 (*)    AST 121 (*)    ALT 198 (*)    All other components within normal limits  TROPONIN I  URINE DRUG SCREEN, QUALITATIVE ([facility] ONLY)     ____________________________________________   EKG  ED ECG REPORT I, [place] Adeline Petitfrere, the attending physician, personally viewed and interpreted this ECG.   Date: 03/06/2016  EKG Time: 3:15 AM  Rate: 74  Rhythm: Normal sinus rhythm  Axis: Normal  Intervals: Normal  ST&T Change: None      INITIAL IMPRESSION / ASSESSMENT AND PLAN / ED COURSE  Pertinent labs & imaging results that were available during my care of the patient were reviewed by me and considered in my medical decision making (see chart for details).  Patient observed in the emergency department for multiple hours with stable vital signs noted patient alert and oriented at this time. I offered detox and the patient to which she refused  ____________________________________________   FINAL CLINICAL IMPRESSION(S) / ED DIAGNOSES  Final diagnoses:  Opiate overdose, accidental or unintentional, initial encounter  Cocaine abuse      Darci Current, MD 03/06/16 [phone]

## 2017-02-12 ENCOUNTER — Encounter: Payer: Self-pay | Admitting: Emergency Medicine

## 2017-02-12 ENCOUNTER — Emergency Department
Admission: EM | Admit: 2017-02-12 | Discharge: 2017-02-12 | Disposition: A | Discharge status: Home / Self Care | Payer: Self-pay | Attending: Emergency Medicine | Admitting: Emergency Medicine

## 2017-02-12 DIAGNOSIS — K029 Dental caries, unspecified: Secondary | ICD-10-CM | POA: Insufficient documentation

## 2017-02-12 DIAGNOSIS — F17210 Nicotine dependence, cigarettes, uncomplicated: Secondary | ICD-10-CM | POA: Insufficient documentation

## 2017-02-12 MED ORDER — IBUPROFEN 600 MG PO TABS: 600.0000 mg | ORAL_TABLET | Freq: Three times a day (TID) | ORAL | 0 refills | Status: AC | PRN

## 2017-02-12 MED ORDER — PENICILLIN V POTASSIUM 500 MG PO TABS: 500.0000 mg | ORAL_TABLET | Freq: Four times a day (QID) | ORAL | 0 refills | Status: DC

## 2017-02-12 NOTE — ED Provider Notes (Signed)
[facility] Emergency Department Provider Note  ____________________________________________   First MD Initiated Contact with Patient 02/12/17 1057     (approximate)  I have reviewed the triage vital signs and the nursing notes.   HISTORY  Chief Complaint Dental Pain    HPI Richard Tanner is a 31 y.o. male is here complaining of right lower dental pain for the last 3 days. Patient states that he has not seen a dentist. He has taken over-the-counter ibuprofen with minimal relief of his pain. He denies any fever or chills. He continues to eat and drink. Patient is a smoker of one half pack cigarettes per day, drinks alcohol, and uses cocaine and marijuana. He currently rates his pain as 9/10.   Past Medical History:  Diagnosis Date  . Cardiac arrhythmia     There are no active problems to display for this patient.   History reviewed. No pertinent surgical history.  Prior to Admission medications   Medication Sig Start Date End Date Taking? Authorizing Provider  ibuprofen (ADVIL,MOTRIN) 600 MG tablet Take 1 tablet (600 mg total) by mouth every 8 (eight) hours as needed. 02/12/17   Tommi RumpsSummers, Lynett Brasil L, PA-C  penicillin v potassium (VEETID) 500 MG tablet Take 1 tablet (500 mg total) by mouth 4 (four) times daily. 02/12/17   Tommi RumpsSummers, Sarinah Doetsch L, PA-C    Allergies Patient has no known allergies.  No family history on file.  Social History Social History  Substance Use Topics  . Smoking status: Current Every Day Smoker    Packs/day: 0.50    Types: Cigarettes  . Smokeless tobacco: Never Used  . Alcohol use 0.6 oz/week    1 Cans of beer per week    Review of Systems Constitutional: No fever/chills Eyes: No visual changes. ENT: Positive dental pain. Cardiovascular: Denies chest pain. Respiratory: Denies shortness of breath. Musculoskeletal: Negative for back pain. Skin: Negative for rash. Neurological: Negative for headaches, focal  weakness or numbness. Psychiatric:Positive for polysubstance abuse.   ___________________________________________   PHYSICAL EXAM:  VITAL SIGNS: ED Triage Vitals  Enc Vitals Group     BP 02/12/17 1048 129/81     Pulse Rate 02/12/17 1048 88     Resp 02/12/17 1048 20     Temp 02/12/17 1048 97.6 F (36.4 C)     Temp Source 02/12/17 1048 Oral     SpO2 02/12/17 1048 98 %     Weight 02/12/17 1049 140 lb (63.5 kg)     Height 02/12/17 1049 5\' 3"  (1.6 m)     Head Circumference --      Peak Flow --      Pain Score 02/12/17 1048 9     Pain Loc --      Pain Edu? --      Excl. in GC? --     Constitutional: Alert and oriented. Well appearing and in no acute distress. Eyes: Conjunctivae are normal. PERRL. EOMI. Head: Atraumatic. Nose: No congestion/rhinnorhea. Mouth/Throat: Mucous membranes are moist.  Oropharynx non-erythematous. Right lower molars are in extremely poor repair and hygiene. Gums are tender to palpation and edematous. No active drainage was noted. Neck: No stridor.   Hematological/Lymphatic/Immunilogical: No cervical lymphadenopathy. Cardiovascular: Normal rate, regular rhythm. Grossly normal heart sounds.  Good peripheral circulation. Respiratory: Normal respiratory effort.  No retractions. Lungs CTAB. Musculoskeletal: Moves upper and lower extremity swelling difficulty. Normal gait was noted. Neurologic:  Normal speech and language. No gross focal neurologic deficits are appreciated. No gait instability.  Skin:  Skin is warm, dry and intact. No rash noted. Psychiatric: Mood and affect are normal. Speech and behavior are normal.  ____________________________________________   LABS (all labs ordered are listed, but only abnormal results are displayed)  Labs Reviewed - No data to display  PROCEDURES  Procedure(s) performed: None  Procedures  Critical Care performed: No  ____________________________________________   INITIAL IMPRESSION / ASSESSMENT AND PLAN  / ED COURSE  Pertinent labs & imaging results that were available during my care of the patient were reviewed by me and considered in my medical decision making (see chart for details).  Patient was placed on Pen-Vee K 500 mg 4 times a day for 7 days. Patient was given a coupon to purchase the antibiotic cheaper at [employer]. He is also given a prescription for ibuprofen to continue taking as needed for pain. He was given a list of dental clinics in the area and encouraged to call.      ____________________________________________   FINAL CLINICAL IMPRESSION(S) / ED DIAGNOSES  Final diagnoses:  Pain due to dental caries      NEW MEDICATIONS STARTED DURING THIS VISIT:  Discharge Medication List as of 02/12/2017 11:24 AM    START taking these medications   Details  penicillin v potassium (VEETID) 500 MG tablet Take 1 tablet (500 mg total) by mouth 4 (four) times daily., Starting Sat 02/12/2017, Print         Note:  This document was prepared using Dragon voice recognition software and may include unintentional dictation errors.    Tommi Rumps, PA-C 02/12/17 1302    Governor Rooks, MD 02/12/17 [phone]

## 2017-02-12 NOTE — Discharge Instructions (Signed)
Plan to get the antibiotic filled at [facility] as it has theLowest Price in [place] at $11.50. A coupon is attached to your discharge papers. Call the dental clinics listed on your  papers. The dental clinic at [facility] takes walkins and extra information is given to you about this.  OPTIONS FOR DENTAL FOLLOW UP CARE  [place] [facility] and [facility] - Local Safety Net Dental Clinics [email or web address]   [facility] [phone])  Sharl Ma [phone])  [place] [phone])  [facility] [phone])  [facility] [phone]) This clinic caters to the indigent population and is on a lottery system. Location: [employer] of Dentistry, [employer], [address], [place] Clinic Hours: Wednesdays from 6pm - 9pm, patients seen by a lottery system. For dates, call or go to [email or web address] Services: Cleanings, fillings and simple extractions. Payment Options: DENTAL WORK IS FREE OF CHARGE. Bring proof of income or support. Best way to get seen: Arrive at 5:15 pm - this is a lottery, NOT first come/first serve, so arriving earlier will not increase your chances of being seen.     [facility] [phone] Select option 1 for emergencies   Location: [facility], [place], [address], [place] Clinic Hours: No walk-ins accepted - call the day before to schedule an appointment. Check in times are 9:30 am and 1:30 pm. Services: Simple extractions, temporary fillings, pulpectomy/pulp debridement, uncomplicated abscess drainage. Payment Options: PAYMENT IS DUE AT THE TIME OF SERVICE.  Fee is usually $100-200, additional surgical procedures (e.g. abscess drainage) may be extra. Cash, checks, Visa/MasterCard accepted.  Can file Medicaid if patient is  covered for dental - patient should call case worker to check. No discount for [facility] patients. Best way to get seen: MUST call the day before and get onto the schedule. Can usually be seen the next 1-2 days. No walk-ins accepted.     [facility] Dental Services [phone]   Location: [facility], [address] [place] Clinic Hours: M, W, Th, F 8am or 1:30pm, Tues 9a or 1:30 - first come/first served. Services: Simple extractions, temporary fillings, uncomplicated abscess drainage.  You do not need to be an [facility] resident. Payment Options: PAYMENT IS DUE AT THE TIME OF SERVICE. Dental insurance, otherwise sliding scale - bring proof of income or support. Depending on income and treatment needed, cost is usually $50-200. Best way to get seen: Arrive early as it is first come/first served.     [facility] [phone]   Location: [address] Clinic Hours: Mon-Thu 8a-5p Services: Most basic dental services including extractions and fillings. Payment Options: PAYMENT IS DUE AT THE TIME OF SERVICE. Sliding scale, up to 50% off - bring proof if income or support. Medicaid with dental option accepted. Best way to get seen: Call to schedule an appointment, can usually be seen within 2 weeks OR they will try to see walk-ins - show up at 8a or 2p (you may have to wait).     [facility] Dental Clinic [phone] [place] RESIDENTS ONLY   Location: [facility], [address] [place], [place] [address] Clinic Hours: By appointment only. Monday - Thursday 8am-5pm, Friday 8am-12pm Services: Cleanings, fillings, extractions. Payment Options: PAYMENT IS DUE AT THE TIME OF SERVICE. Cash, Visa or MasterCard. Sliding scale - $30 minimum per service. Best way to get  seen: Come in to office, complete packet and make an appointment - need proof of income or support  monies for each household member and proof of [place] [place] [place] residence. Usually takes about a month to get in.     [facility] [phone]   Location: [address] [facility] Hours: Walk-in Urgent Care Dental Services are offered Monday-Friday mornings only. The numbers of emergencies accepted daily is limited to the number of providers available. Maximum 15 - Mondays, Wednesdays & Thursdays Maximum 10 - Tuesdays & Fridays Services: You do not need to be a [place] resident to be seen for a dental emergency. Emergencies are defined as pain, swelling, abnormal bleeding, or dental trauma. Walkins will receive x-rays if needed. NOTE: Dental cleaning is not an emergency. Payment Options: PAYMENT IS DUE AT THE TIME OF SERVICE. Minimum co-pay is $40.00 for uninsured patients. Minimum co-pay is $3.00 for Medicaid with dental coverage. Dental Insurance is accepted and must be presented at time of visit. Medicare does not cover dental. Forms of payment: Cash, credit card, checks. Best way to get seen: If not previously registered with the clinic, walk-in dental registration begins at 7:15 am and is on a first come/first serve basis. If previously registered with the clinic, call to make an appointment.     [facility] [phone] [place] RESIDENTS ONLY   Location: [address], [place], [place] Clinic Hours: Mon-Thu 10a-2p Services: Extractions only! Payment Options: FREE (donations accepted) - bring proof of income or support Best way to get seen: Call and schedule an appointment OR come at 8am on the 1st Monday of every month (except for holidays) when it is first come/first served.     [facility] [phone]   Location: [address], [place] Clinic Hours: Friday mornings Services, Payment Options, Best way to get seen: Call for info

## 2017-02-12 NOTE — ED Triage Notes (Signed)
Lower R dental pain x 3 days.  

## 2017-03-08 ENCOUNTER — Encounter: Payer: Self-pay | Admitting: Emergency Medicine

## 2017-03-08 ENCOUNTER — Emergency Department
Admission: EM | Admit: 2017-03-08 | Discharge: 2017-03-09 | Disposition: A | Discharge status: Home / Self Care | Payer: Self-pay | Attending: Emergency Medicine | Admitting: Emergency Medicine

## 2017-03-08 DIAGNOSIS — R4189 Other symptoms and signs involving cognitive functions and awareness: Secondary | ICD-10-CM

## 2017-03-08 DIAGNOSIS — Y92009 Unspecified place in unspecified non-institutional (private) residence as the place of occurrence of the external cause: Secondary | ICD-10-CM | POA: Insufficient documentation

## 2017-03-08 DIAGNOSIS — F17210 Nicotine dependence, cigarettes, uncomplicated: Secondary | ICD-10-CM | POA: Insufficient documentation

## 2017-03-08 DIAGNOSIS — Y998 Other external cause status: Secondary | ICD-10-CM | POA: Insufficient documentation

## 2017-03-08 DIAGNOSIS — Z791 Long term (current) use of non-steroidal anti-inflammatories (NSAID): Secondary | ICD-10-CM | POA: Insufficient documentation

## 2017-03-08 DIAGNOSIS — Y9389 Activity, other specified: Secondary | ICD-10-CM | POA: Insufficient documentation

## 2017-03-08 DIAGNOSIS — T401X1A Poisoning by heroin, accidental (unintentional), initial encounter: Secondary | ICD-10-CM | POA: Insufficient documentation

## 2017-03-08 LAB — CBC
HEMATOCRIT: 41.3 % (ref 40.0–52.0)
HEMOGLOBIN: 13.6 g/dL (ref 13.0–18.0)
MCH: 25.5 pg — ABNORMAL LOW (ref 26.0–34.0)
MCHC: 33 g/dL (ref 32.0–36.0)
MCV: 77.2 fL — AB (ref 80.0–100.0)
Platelets: 296 10*3/uL (ref 150–440)
RBC: 5.35 MIL/uL (ref 4.40–5.90)
RDW: 15.9 % — ABNORMAL HIGH (ref 11.5–14.5)
WBC: 9 10*3/uL (ref 3.8–10.6)

## 2017-03-08 LAB — COMPREHENSIVE METABOLIC PANEL
ALBUMIN: 4.4 g/dL (ref 3.5–5.0)
ALT: 143 U/L — ABNORMAL HIGH (ref 17–63)
AST: 99 U/L — AB (ref 15–41)
Alkaline Phosphatase: 79 U/L (ref 38–126)
Anion gap: 13 (ref 5–15)
BILIRUBIN TOTAL: 0.7 mg/dL (ref 0.3–1.2)
BUN: 10 mg/dL (ref 6–20)
CHLORIDE: 97 mmol/L — AB (ref 101–111)
CO2: 26 mmol/L (ref 22–32)
Calcium: 9.4 mg/dL (ref 8.9–10.3)
Creatinine, Ser: 1.37 mg/dL — ABNORMAL HIGH (ref 0.61–1.24)
GFR calc Af Amer: >60 mL/min (ref >60)
GFR calc non Af Amer: >60 mL/min (ref >60)
GLUCOSE: 211 mg/dL — AB (ref 65–99)
POTASSIUM: 4.4 mmol/L (ref 3.5–5.1)
SODIUM: 136 mmol/L (ref 135–145)
Total Protein: 8.5 g/dL — ABNORMAL HIGH (ref 6.5–8.1)

## 2017-03-08 LAB — ACETAMINOPHEN LEVEL

## 2017-03-08 LAB — SALICYLATE LEVEL: Salicylate Lvl: <7 mg/dL (ref 2.8–30.0)

## 2017-03-08 LAB — ETHANOL: Alcohol, Ethyl (B): <5 mg/dL (ref <5)

## 2017-03-08 MED ORDER — ONDANSETRON HCL 4 MG/2ML IJ SOLN
INTRAMUSCULAR | Status: AC
  Filled 2017-03-08: qty 2

## 2017-03-08 MED ORDER — SODIUM CHLORIDE 0.9 % IV BOLUS (SEPSIS)
1000.0000 mL | Freq: Once | INTRAVENOUS | Status: AC
  Administered 2017-03-08: 1000 mL via INTRAVENOUS

## 2017-03-08 MED ORDER — ONDANSETRON HCL 4 MG/2ML IJ SOLN
4.0000 mg | Freq: Once | INTRAMUSCULAR | Status: AC
  Administered 2017-03-08: 4 mg via INTRAVENOUS

## 2017-03-08 NOTE — ED Triage Notes (Signed)
Pt arrived via ems from a friends residence where pt was found unconscious. Pt was given 0.5mg  by ems and became responsive. Pt admits to using heroin today but denies any other substance. Pt alert upon arrival and breathing independently with O2 saturation of 100% on room air.

## 2017-03-08 NOTE — ED Provider Notes (Signed)
[facility] Emergency Department Provider Note   ____________________________________________   First MD Initiated Contact with Patient 03/08/17 2304     (approximate)  I have reviewed the triage vital signs and the nursing notes.   HISTORY  Chief Complaint Drug Overdose    HPI Richard Tanner is a 31 y.o. male brought to the ED from a friend's residence via EMS with a chief complaint of heroin overdose. Patient admits to shooting up this evening. He was found at his friend's house unresponsive. Patient was given 0.5 mg IV Narcan by EMS with good effect. Arrives to the ED alert, oriented, breathing spontaneously with room air saturations of 100%. Denies intentional overdose or self-harm. Denies recent fever, chills, chest pain, shortness of breath, abdominal pain, nausea, vomiting, diarrhea. Denies recent trauma. Voices no medical complaints at this time.   Past Medical History:  Diagnosis Date  . Cardiac arrhythmia     There are no active problems to display for this patient.   History reviewed. No pertinent surgical history.  Prior to Admission medications   Medication Sig Start Date End Date Taking? Authorizing Provider  ibuprofen (ADVIL,MOTRIN) 600 MG tablet Take 1 tablet (600 mg total) by mouth every 8 (eight) hours as needed. 02/12/17   Tommi RumpsSummers, Rhonda L, PA-C    Allergies Patient has no known allergies.  No family history on file.  Social History Social History  Substance Use Topics  . Smoking status: Current Every Day Smoker    Packs/day: 0.50    Types: Cigarettes  . Smokeless tobacco: Never Used  . Alcohol use 0.6 oz/week    1 Cans of beer per week    Review of Systems  Constitutional: Positive for unresponsive state. No fever/chills. Eyes: No visual changes. ENT: No sore throat. Cardiovascular: Denies chest pain. Respiratory: Denies shortness of breath. Gastrointestinal: No abdominal pain.  No nausea, no  vomiting.  No diarrhea.  No constipation. Genitourinary: Negative for dysuria. Musculoskeletal: Negative for back pain. Skin: Negative for rash. Neurological: Negative for headaches, focal weakness or numbness.   ____________________________________________   PHYSICAL EXAM:  VITAL SIGNS: ED Triage Vitals [03/08/17 2302]  Enc Vitals Group     BP      Pulse      Resp      Temp      Temp src      SpO2      Weight 132 lb (59.9 kg)     Height 5\' 3"  (1.6 m)     Head Circumference      Peak Flow      Pain Score      Pain Loc      Pain Edu?      Excl. in GC?     Constitutional: Alert and oriented. Disheveled appearing and in no acute distress. Eyes: Conjunctivae are normal. PERRL. pinpoint pupils. EOMI. Head: Atraumatic. Nose: No congestion/rhinnorhea. Mouth/Throat: Mucous membranes are moist.  Oropharynx non-erythematous. Neck: No stridor.  No cervical spine tenderness to palpation. Cardiovascular: Normal rate, regular rhythm. Grossly normal heart sounds.  Good peripheral circulation. Respiratory: Normal respiratory effort.  No retractions. Lungs CTAB. Gastrointestinal: Soft and nontender. No distention. No abdominal bruits. No CVA tenderness. Musculoskeletal: No lower extremity tenderness nor edema.  No joint effusions. Neurologic:  Sleepy but arousable to voice. Normal speech and language. No gross focal neurologic deficits are appreciated.  Skin:  Skin is warm, dry and intact. No rash noted. Psychiatric: Mood and affect are normal. Speech and behavior  are normal.  ____________________________________________   LABS (all labs ordered are listed, but only abnormal results are displayed)  Labs Reviewed  COMPREHENSIVE METABOLIC PANEL - Abnormal; Notable for the following:       Result Value   Chloride 97 (*)    Glucose, Bld 211 (*)    Creatinine, Ser 1.37 (*)    Total Protein 8.5 (*)    AST 99 (*)    ALT 143 (*)    All other components within normal limits    ACETAMINOPHEN LEVEL - Abnormal; Notable for the following:    Acetaminophen (Tylenol), Serum <10 (*)    All other components within normal limits  CBC - Abnormal; Notable for the following:    MCV 77.2 (*)    MCH 25.5 (*)    RDW 15.9 (*)    All other components within normal limits  URINE DRUG SCREEN, QUALITATIVE ([facility] ONLY) - Abnormal; Notable for the following:    Cocaine Metabolite,Ur [place] POSITIVE (*)    Opiate, Ur Screen POSITIVE (*)    All other components within normal limits  ETHANOL  SALICYLATE LEVEL  CBG MONITORING, ED   ____________________________________________  EKG  ED ECG REPORT I, Kacia Halley J, the attending physician, personally viewed and interpreted this ECG.   Date: 03/08/2017  EKG Time: 2305  Rate: 104  Rhythm: sinus tachycardia  Axis: Normal  Intervals:none  ST&T Change: Nonspecific  ____________________________________________  RADIOLOGY  Dg Chest Port 1 View  Result Date: 03/09/2017 CLINICAL DATA:  Heroin overdose EXAM: PORTABLE CHEST 1 VIEW COMPARISON:  02/11/2016 CXR FINDINGS: The heart size and mediastinal contours are within normal limits. Both lungs are clear. The visualized skeletal structures are unremarkable. IMPRESSION: No active disease. Electronically Signed   By: Tollie Eth M.D.   On: 03/09/2017 00:18    ____________________________________________   PROCEDURES  Procedure(s) performed: None  Procedures  Critical Care performed: No  ____________________________________________   INITIAL IMPRESSION / ASSESSMENT AND PLAN / ED COURSE  Pertinent labs & imaging results that were available during my care of the patient were reviewed by me and considered in my medical decision making (see chart for details).  31 year old male who presents status post unintentional heroin overdose. He is drowsy but arousable to voice. Follows commands and is cooperative. Will obtain screening lab work, chest x-ray, initiate IV fluid resuscitation  and monitor in the emergency department.  Clinical Course as of Mar 09 732  Wed Mar 09, 2017  0257 Patient waking up more but remains groggy. Soft blood pressure; will administer additional liter of IV fluid. Sandwich tray given.  [JS]  [id number] Patient remained sleepy and was signed out to Dr. Silverio Lay at change of shift. Anticipate discharge home if patient is able to remain awake and ambulatory.  [JS]    Clinical Course User Index [JS] Irean Hong, MD     ____________________________________________   FINAL CLINICAL IMPRESSION(S) / ED DIAGNOSES  Final diagnoses:  Accidental overdose of heroin, initial encounter  Unresponsive      NEW MEDICATIONS STARTED DURING THIS VISIT:  New Prescriptions   No medications on file     Note:  This document was prepared using Dragon voice recognition software and may include unintentional dictation errors.    Irean Hong, MD 03/09/17 [phone]

## 2017-03-09 ENCOUNTER — Emergency Department: Payer: Self-pay

## 2017-03-09 LAB — URINE DRUG SCREEN, QUALITATIVE (ARMC ONLY)
Amphetamines, Ur Screen: NOT DETECTED
Barbiturates, Ur Screen: NOT DETECTED
Benzodiazepine, Ur Scrn: NOT DETECTED
COCAINE METABOLITE, UR ~~LOC~~: POSITIVE — AB
Cannabinoid 50 Ng, Ur ~~LOC~~: NOT DETECTED
MDMA (ECSTASY) UR SCREEN: NOT DETECTED
METHADONE SCREEN, URINE: NOT DETECTED
Opiate, Ur Screen: POSITIVE — AB
Phencyclidine (PCP) Ur S: NOT DETECTED
TRICYCLIC, UR SCREEN: NOT DETECTED

## 2017-03-09 MED ORDER — SODIUM CHLORIDE 0.9 % IV BOLUS (SEPSIS)
1000.0000 mL | Freq: Once | INTRAVENOUS | Status: AC
  Administered 2017-03-09: 1000 mL via INTRAVENOUS

## 2017-03-09 NOTE — ED Notes (Signed)
Pt resting in bed, resp even and unlabored, eyes closed, pt on monitor

## 2017-03-09 NOTE — Discharge Instructions (Signed)
Avoid using drugs or alcohol   See your doctor  Return to ER if you have overdose, depression, thoughts of harming yourself or others.

## 2017-03-09 NOTE — ED Provider Notes (Signed)
  Physical Exam  BP (!) 93/51   Pulse 62   Temp 97.6 F (36.4 C) (Oral)   Resp 18   Ht 5\' 3"  (1.6 m)   Wt 59.9 kg (132 lb)   SpO2 98%   BMI 23.38 kg/m   Physical Exam  ED Course  Procedures  MDM Care assumed at 7 am. Patient had heroin overdose that is accidental. Observed overnight and now awake and alert. Borderline BP at night but given IVF and labs unremarkable. Felt better, ate food this morning. Denies suicidal ideation. Has court date today and he wants to go to court. Will dc home.    Charlynne PanderYao, Myeasha Ballowe Hsienta, MD 03/09/17 [phone]

## 2017-03-09 NOTE — ED Notes (Signed)
Pt given sandwich tray by MD.

## 2017-03-09 NOTE — ED Notes (Signed)
Pt awake and alert, eating and drinking, ambulatory

## 2017-03-09 NOTE — ED Notes (Signed)
Pt questions length of stay. Pt explains he has to be at court today at 0900. Pt made aware that we cannot give an exact time of d/c and our priority was to make pt medically stable and  before discharge.

## 2017-03-09 NOTE — ED Notes (Signed)
Pt unable to stay awake and eat safely. Pt woken up and asked to finish chewing and swallowing sandwich and to put it away until he was more alert. Pt understanding and cooperative.

## 2017-03-09 NOTE — ED Notes (Signed)
Pt awake, given some water, states he is working on a discharge ride

## 2017-03-09 NOTE — ED Notes (Signed)
Pt's O2 dropped to 87% while sleeping, pt placed on 2L nasal canula.

## 2017-10-23 IMAGING — DX DG CHEST 1V PORT
1 series · 1 of 1 positions shown · non-contrast
Comparison: 02/11/2016 CXR

CLINICAL DATA: Heroin overdose

EXAM:
PORTABLE CHEST 1 VIEW

[chest ap]
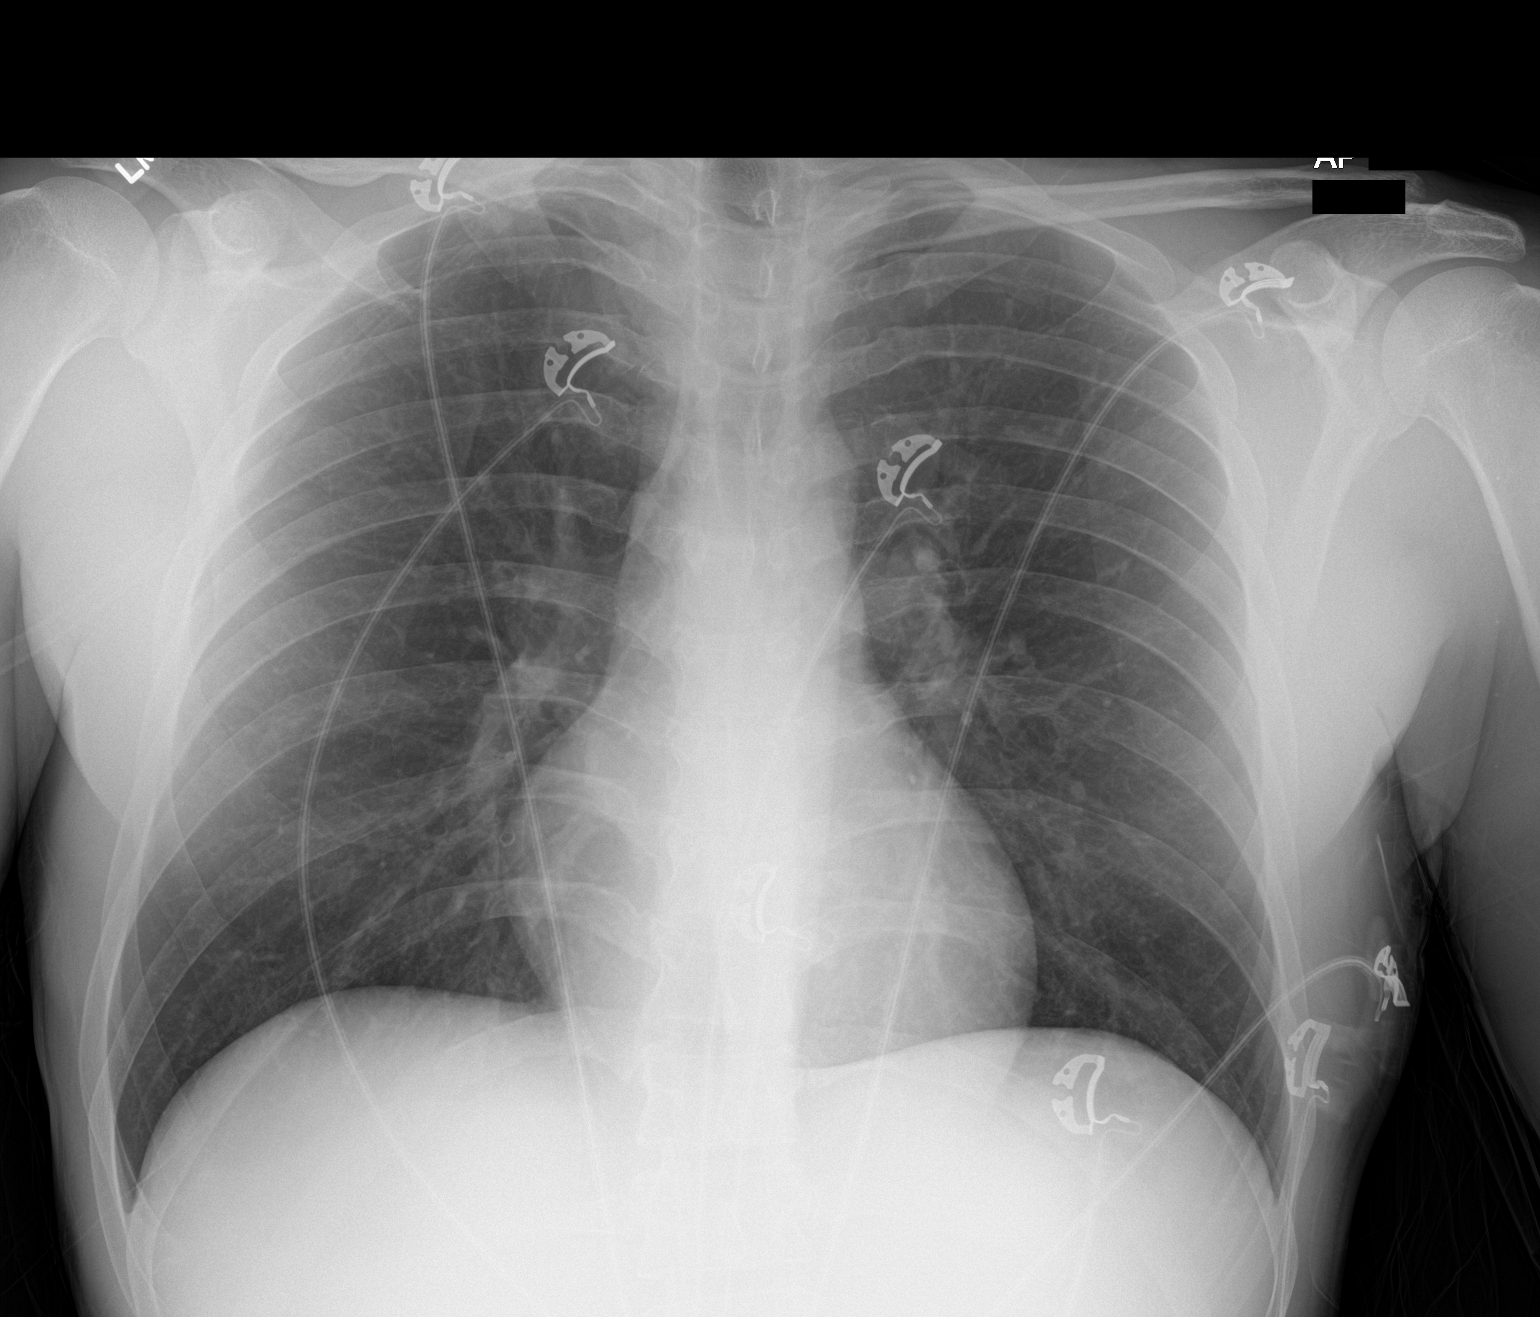

[1 of 1 positions shown; findings below may reference images not displayed]

FINDINGS: The heart size and mediastinal contours are within normal limits.
Both lungs are clear. The visualized skeletal structures are
unremarkable.
IMPRESSION: No active disease.
